# Patient Record
Sex: Female | Born: 2000 | Race: White | Hispanic: No | Marital: Single | Smoking: Never smoker
Health system: Southern US, Community
[De-identification: ages and names within clinical notes are randomized; demographics above are authoritative.]

## PROBLEM LIST (undated history)

## (undated) DIAGNOSIS — S060XAA Concussion with loss of consciousness status unknown, initial encounter: Secondary | ICD-10-CM

## (undated) DIAGNOSIS — S060X9A Concussion with loss of consciousness of unspecified duration, initial encounter: Secondary | ICD-10-CM

## (undated) DIAGNOSIS — F329 Major depressive disorder, single episode, unspecified: Secondary | ICD-10-CM

## (undated) DIAGNOSIS — G43109 Migraine with aura, not intractable, without status migrainosus: Secondary | ICD-10-CM

## (undated) DIAGNOSIS — F32A Depression, unspecified: Secondary | ICD-10-CM

## (undated) HISTORY — PX: TONSILLECTOMY: SUR1361

## (undated) HISTORY — PX: WISDOM TOOTH EXTRACTION: SHX21

## (undated) HISTORY — DX: Migraine with aura, not intractable, without status migrainosus: G43.109

## (undated) HISTORY — DX: Depression, unspecified: F32.A

---
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
## 2021-04-12 IMAGING — CR LUMBAR SPINE - 2-3 VIEW
1 series · 3 of 3 positions shown · non-contrast
Comparison: 11/28/2015

CLINICAL DATA: Chronic left-sided low back pain

EXAM:
LUMBAR SPINE - 2-3 VIEW

[Series 1: dg lumbar spine 2-3 views · 0.14mm/px · 3 of 3 slices shown]
[im 1/3]
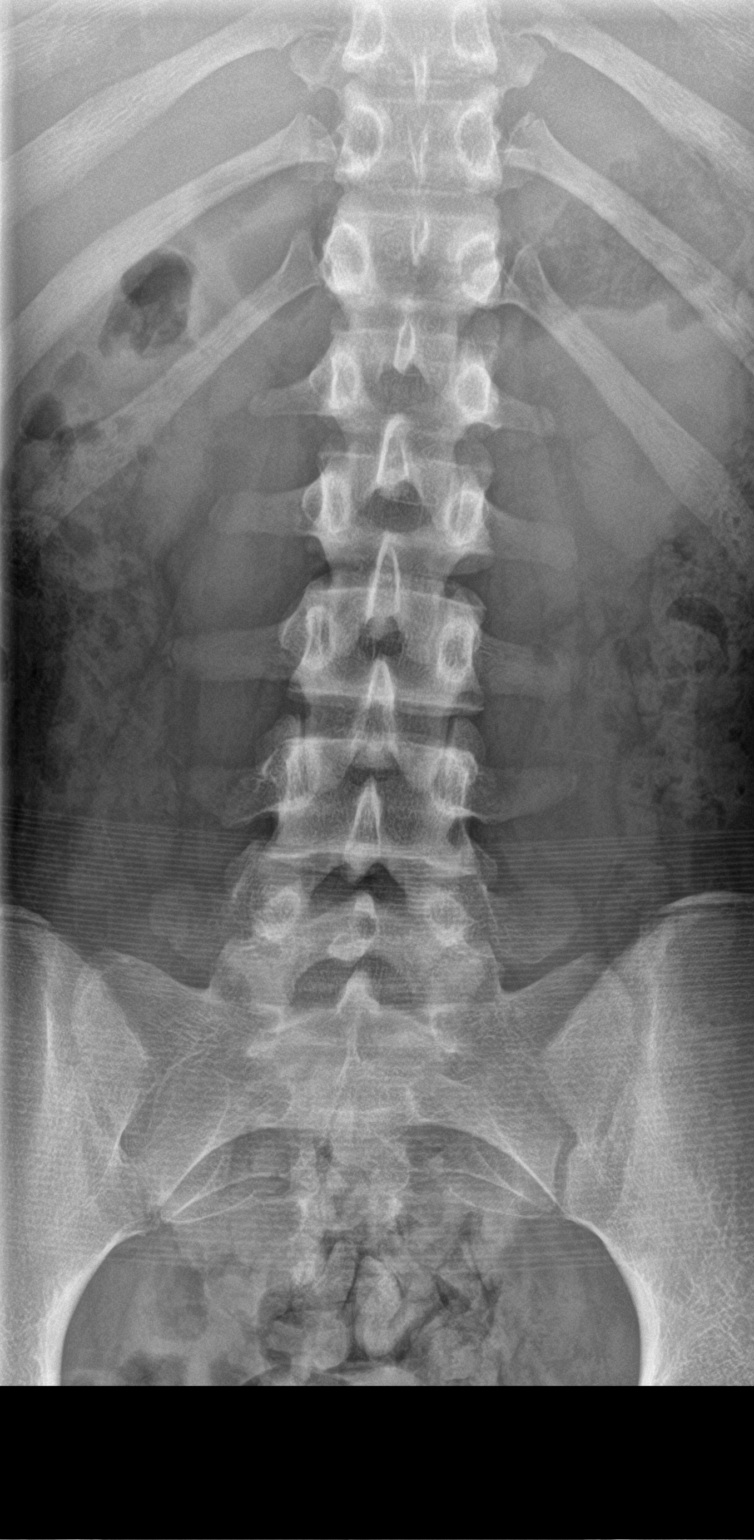
[im 2/3]
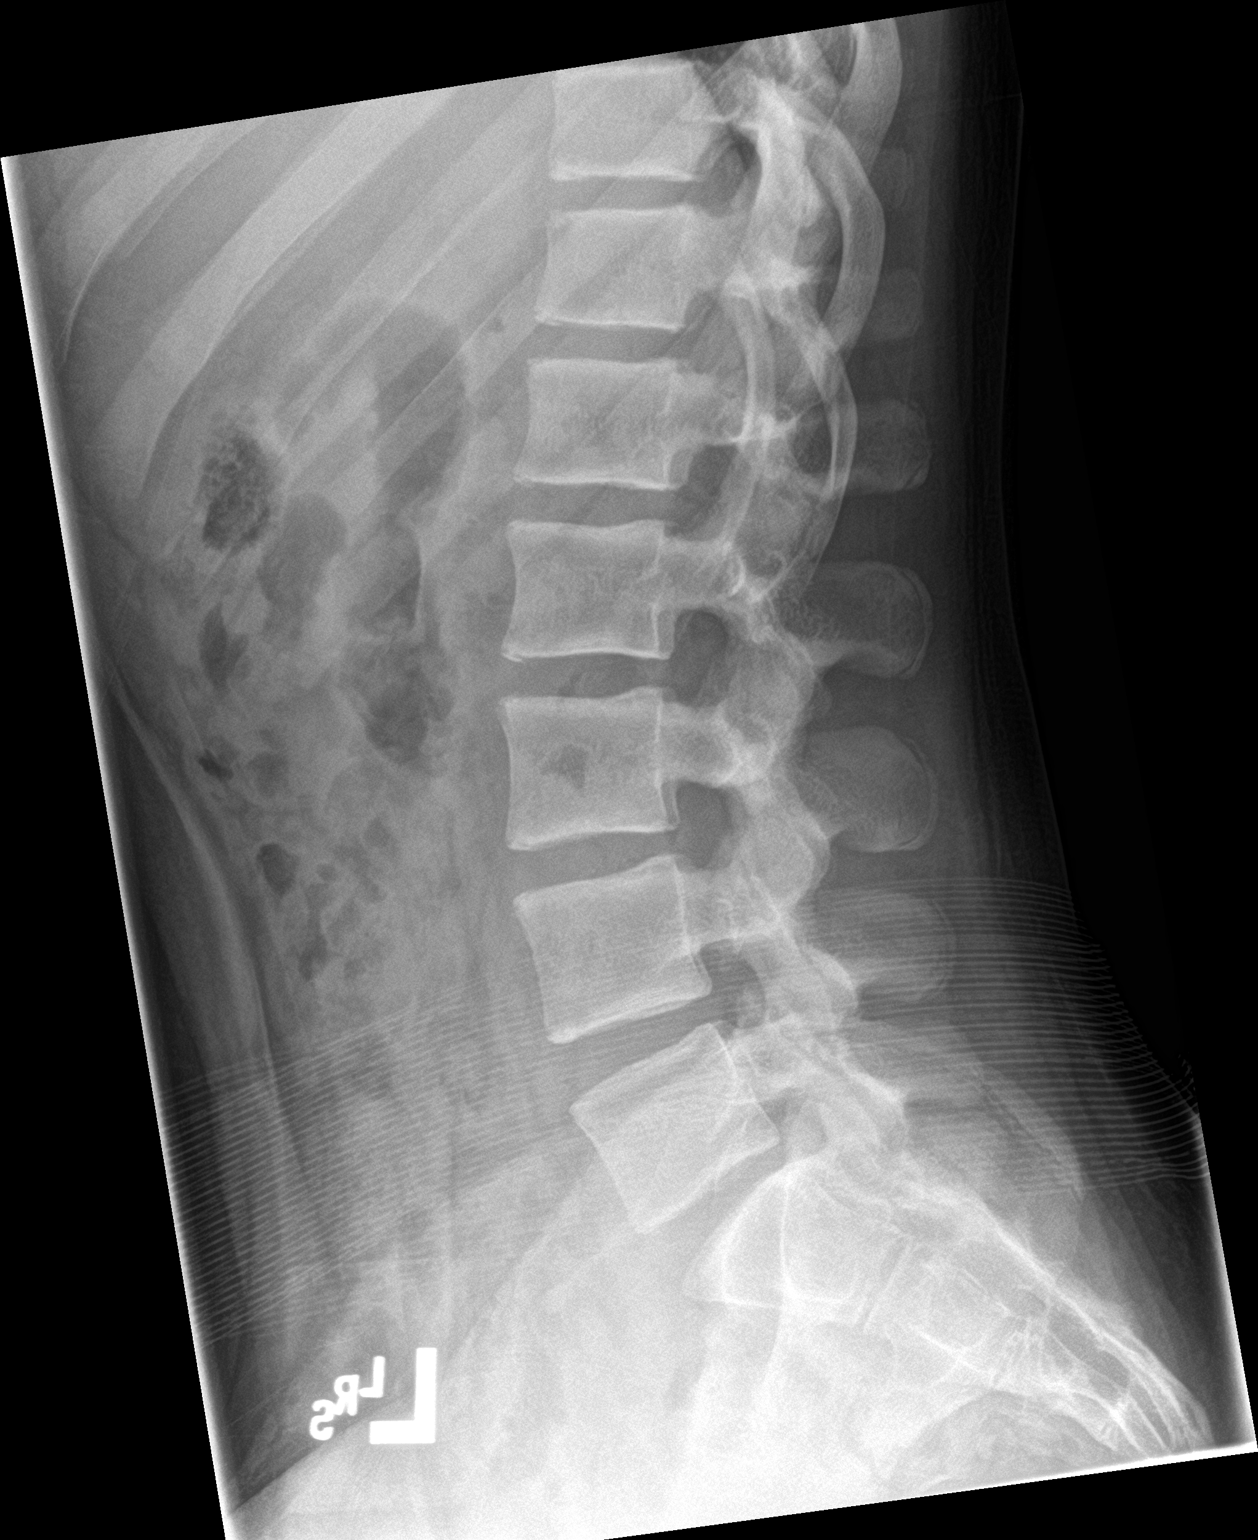
[im 3/3]
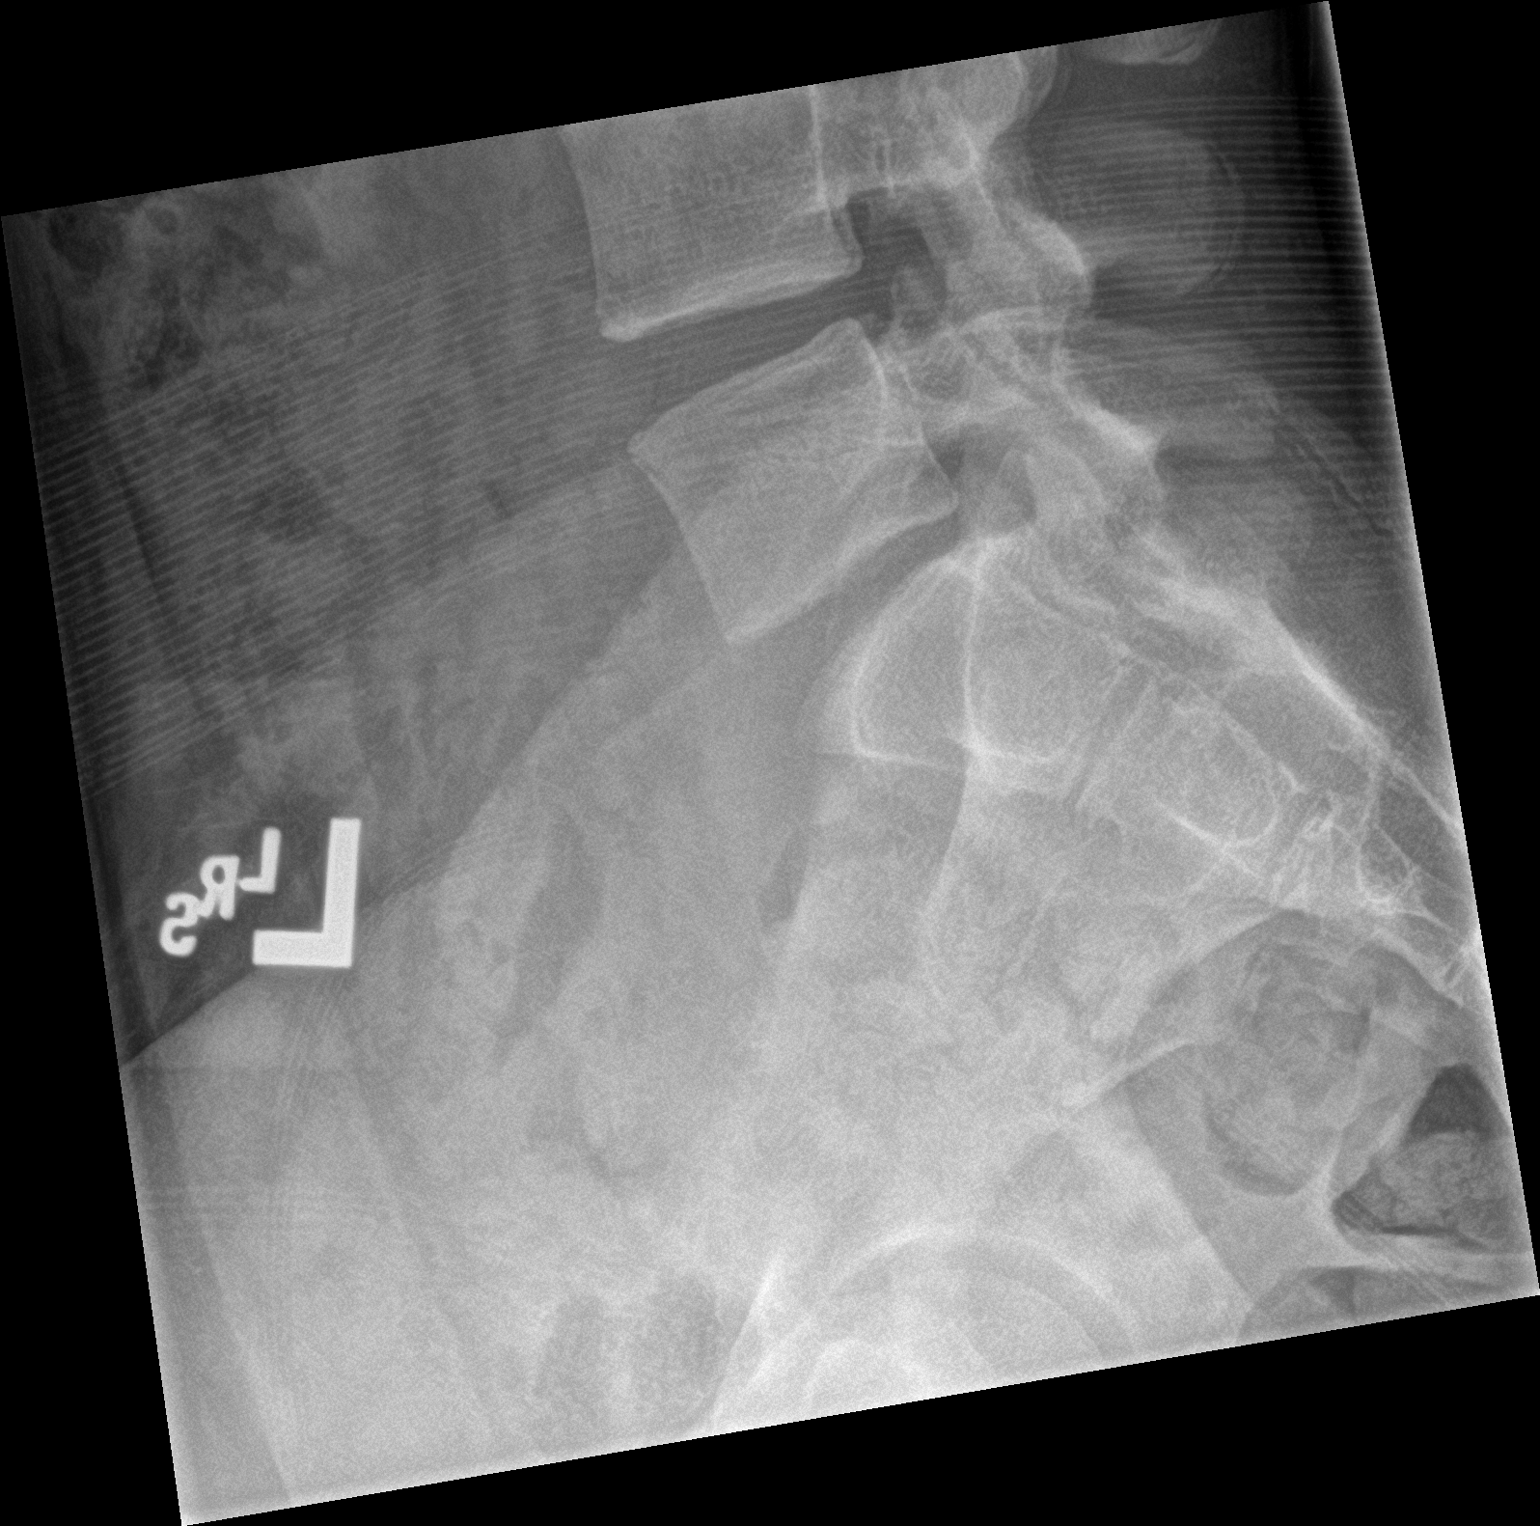

[3 of 3 positions shown; findings below may reference images not displayed]

FINDINGS: There are 5 nonrib bearing lumbar-type vertebral bodies.

The vertebral body heights are maintained.

The alignment is anatomic. There is no static listhesis. There is no
spondylolysis.

There is no acute fracture.

The disc spaces are maintained.

The SI joints are unremarkable.
IMPRESSION: No acute osseous injury of the lumbar spine.

## 2021-09-10 ENCOUNTER — Other Ambulatory Visit: Payer: Self-pay | Admitting: Pediatrics

## 2021-09-10 DIAGNOSIS — N63 Unspecified lump in unspecified breast: Secondary | ICD-10-CM

## 2021-09-19 ENCOUNTER — Ambulatory Visit
Admission: RE | Admit: 2021-09-19 | Discharge: 2021-09-19 | Disposition: A | Discharge status: Home / Self Care | Payer: Medicaid Other | Source: Ambulatory Visit | Attending: Pediatrics | Admitting: Pediatrics

## 2021-09-19 DIAGNOSIS — N63 Unspecified lump in unspecified breast: Secondary | ICD-10-CM

## 2022-07-12 ENCOUNTER — Other Ambulatory Visit: Payer: Self-pay

## 2022-07-12 ENCOUNTER — Emergency Department
Admission: EM | Admit: 2022-07-12 | Discharge: 2022-07-13 | Disposition: A | Discharge status: Other Acute Inpatient Hospital | Payer: Medicaid Other | Attending: Emergency Medicine | Admitting: Emergency Medicine

## 2022-07-12 DIAGNOSIS — F329 Major depressive disorder, single episode, unspecified: Secondary | ICD-10-CM | POA: Insufficient documentation

## 2022-07-12 DIAGNOSIS — F410 Panic disorder [episodic paroxysmal anxiety] without agoraphobia: Secondary | ICD-10-CM

## 2022-07-12 DIAGNOSIS — F411 Generalized anxiety disorder: Secondary | ICD-10-CM

## 2022-07-12 DIAGNOSIS — Z1152 Encounter for screening for COVID-19: Secondary | ICD-10-CM | POA: Insufficient documentation

## 2022-07-12 DIAGNOSIS — F32A Depression, unspecified: Secondary | ICD-10-CM | POA: Diagnosis present

## 2022-07-12 DIAGNOSIS — F331 Major depressive disorder, recurrent, moderate: Secondary | ICD-10-CM | POA: Diagnosis not present

## 2022-07-12 DIAGNOSIS — F339 Major depressive disorder, recurrent, unspecified: Secondary | ICD-10-CM | POA: Insufficient documentation

## 2022-07-12 LAB — ETHANOL: Alcohol, Ethyl (B): <10 mg/dL (ref <10)

## 2022-07-12 LAB — URINE DRUG SCREEN, QUALITATIVE (ARMC ONLY)
Amphetamines, Ur Screen: NOT DETECTED
Barbiturates, Ur Screen: NOT DETECTED
Benzodiazepine, Ur Scrn: NOT DETECTED
Cannabinoid 50 Ng, Ur ~~LOC~~: POSITIVE — AB
Cocaine Metabolite,Ur ~~LOC~~: NOT DETECTED
MDMA (Ecstasy)Ur Screen: NOT DETECTED
Methadone Scn, Ur: NOT DETECTED
Opiate, Ur Screen: NOT DETECTED
Phencyclidine (PCP) Ur S: NOT DETECTED
Tricyclic, Ur Screen: NOT DETECTED

## 2022-07-12 LAB — COMPREHENSIVE METABOLIC PANEL
ALT: 17 U/L (ref 0–44)
AST: 24 U/L (ref 15–41)
Albumin: 4.4 g/dL (ref 3.5–5.0)
Alkaline Phosphatase: 84 U/L (ref 38–126)
Anion gap: 9 (ref 5–15)
BUN: 12 mg/dL (ref 6–20)
CO2: 26 mmol/L (ref 22–32)
Calcium: 9 mg/dL (ref 8.9–10.3)
Chloride: 102 mmol/L (ref 98–111)
Creatinine, Ser: 0.75 mg/dL (ref 0.44–1.00)
GFR, Estimated: >60 mL/min (ref >60)
Glucose, Bld: 82 mg/dL (ref 70–99)
Potassium: 3.7 mmol/L (ref 3.5–5.1)
Sodium: 137 mmol/L (ref 135–145)
Total Bilirubin: 0.6 mg/dL (ref 0.3–1.2)
Total Protein: 8.2 g/dL — ABNORMAL HIGH (ref 6.5–8.1)

## 2022-07-12 LAB — CBC
HCT: 40.3 % (ref 36.0–46.0)
Hemoglobin: 13.2 g/dL (ref 12.0–15.0)
MCH: 29.5 pg (ref 26.0–34.0)
MCHC: 32.8 g/dL (ref 30.0–36.0)
MCV: 90.2 fL (ref 80.0–100.0)
Platelets: 377 10*3/uL (ref 150–400)
RBC: 4.47 MIL/uL (ref 3.87–5.11)
RDW: 12 % (ref 11.5–15.5)
WBC: 5.2 10*3/uL (ref 4.0–10.5)
nRBC: 0 % (ref 0.0–0.2)

## 2022-07-12 LAB — SALICYLATE LEVEL: Salicylate Lvl: <7 mg/dL — ABNORMAL LOW (ref 7.0–30.0)

## 2022-07-12 LAB — POC URINE PREG, ED: Preg Test, Ur: NEGATIVE

## 2022-07-12 LAB — ACETAMINOPHEN LEVEL: Acetaminophen (Tylenol), Serum: <10 ug/mL — ABNORMAL LOW (ref 10–30)

## 2022-07-12 MED ORDER — LORAZEPAM 1 MG PO TABS
1.0000 mg | ORAL_TABLET | Freq: Once | ORAL | Status: AC
  Administered 2022-07-12: 1 mg via ORAL
  Filled 2022-07-12: qty 1

## 2022-07-12 NOTE — ED Notes (Addendum)
Pt given nighttime snack. Pt stated "I haven't really been eating". Pt accepted snack and encouraged to eat.

## 2022-07-12 NOTE — ED Notes (Signed)
Pt arrived from triage

## 2022-07-12 NOTE — ED Notes (Signed)
Pt very tearful stating "I want to leave now, I'm done". MD, RN, and TTS notified.

## 2022-07-12 NOTE — ED Triage Notes (Addendum)
Pt states "I'm not doing good, I'm really sick." Pt reports she's been throwing up a lot, has been tearful frequently, has not been sleeping for a week. Friend states she's been using drugs and acting erratically and has lost 30 pounds this year, reports family history of bipolar disorder. Friend reports passing of grandmother as precipitating event. Pt is prescribed prozac but has not been taking it regularly. Pt states "I've been doing some crazy shit." Pt reports thoughts of self harm, has no plan. Pt denies HI. Pt AOX4, tearful in triage, cooperative.

## 2022-07-12 NOTE — ED Notes (Signed)
Pt called someone to come get her. Pt convinced by friend to stay and take medicine to try to get some sleep and stay through the night. RN notified.

## 2022-07-12 NOTE — ED Notes (Signed)
Pt expressed concern about being in the hallway and stated "If I have to sleep here then I'm leaving". Pt aware that there are no rooms available at this time. Pt encouraged to stay long enough to speak to psychiatry.

## 2022-07-12 NOTE — BH Assessment (Signed)
Comprehensive Clinical Assessment (CCA) Screening, Triage and Referral Note  07/12/2022 Hannah Le [id number] Recommendations for Services/Supports/Treatments: Consulted with Hannah Lore D., NP, who recommends pt receiver inpatient criteria.  Hannah Le is a 22 year old, English speaking, Caucasian female with a hx of MDD, and anxiety. Pt was visibly anxious and cooperative. Pt had a disheveled appearance. Pt was alert and oriented x5. Pt's speech was linear, goal directed, and coherent. Pt's thought processes were logical and relevant to the content of the conversation. Motor behavior appeared restless as her feet were shaking incessantly. Eye contact was good. Pt's mood is depressed; affect is tearful. Patient was noted to have good insight as she was able to understand the ways in which her depression and anxiety sx impact her ability to properly function. Pt reported having problems with ADLs, frequent crying spells, and panic attacks. Pt explained that she often throws up when her anxiety is high. Pt identified her stressors as the loss of multiple family members in the last year. Pt explained that her grandmother passed away about 1 month ago. 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 The pt admitted to alcohol, weed, and cocaine use explaining that she last consumed alcohol and weed on Thursday 07/10/22. Pt reported that she gets "black out drunk". The pt. reported that she did not sleep at all last night. The patient denied NSSIB, SI, HI, and AV/H.  Chief Complaint:  Chief Complaint  Patient presents with   Psychiatric Evaluation   Visit Diagnosis: MDD, recurrent severe GAD Panic Disorder  Patient Reported Information How did you hear about [place]? Family/Friend  What Is the Reason for Your Visit/Call Today? Pt states "I'm not doing good, I'm really sick." Pt reports she's been throwing up a lot, has been tearful  frequently, has not been sleeping for a week. Friend states she's been using drugs and acting erratically and has lost 30 pounds this year, reports family history of bipolar disorder. Friend reports passing of grandmother as precipitating event. Pt is prescribed prozac but has not been taking it regularly. Pt states "I've been doing some crazy shit." Pt reports thoughts of self harm, has no plan.  How Long Has This Been Causing You Problems? > than 6 months  What Do You Feel Would Help You the Most Today? Stress Management; Treatment for Depression or other mood problem   Have You Recently Had Any Thoughts About Hurting Yourself? No  Are You Planning to Commit Suicide/Harm Yourself At This time? No   Have you Recently Had Thoughts About [place]? No  Are You Planning to Harm Someone at This Time? No  Explanation: n/a   Have You Used Any Alcohol or Drugs in the Past 24 Hours? No  How Long Ago Did You Use Drugs or Alcohol? No data recorded What Did You Use and How Much? n/a   Do You Currently Have a Therapist/Psychiatrist? No  Name of Therapist/Psychiatrist: n/a   Have You Been Recently Discharged From Any Office Practice or Programs? No  Explanation of Discharge From Practice/Program: n/a    CCA Screening Triage Referral Assessment Type of Contact: Face-to-Face  Telemedicine Service Delivery:   Is this Initial or Reassessment?   Date Telepsych consult ordered in [facility]:    Time Telepsych consult ordered in [facility]:    Location of Assessment: [facility] ED  Provider Location: [facility] ED    Collateral Involvement: None provided   Does Patient Have a Court  Appointed Legal Guardian? No data recorded Name and Contact of Legal Guardian: No data recorded If Minor and Not Living with Parent(s), Who has Custody? n/a  Is CPS involved or ever been involved? Never  Is APS involved or ever been involved? Never   Patient Determined To Be At Risk for Harm To Self or Others  Based on Review of Patient Reported Information or Presenting Complaint? No  Method: No Plan  Availability of Means: No access or NA  Intent: Vague intent or NA  Notification Required: No need or identified person  Additional Information for Danger to Others Potential: -- (n/a)  Additional Comments for Danger to Others Potential: n/a  Are There Guns or Other Weapons in Your Home? No  Types of Guns/Weapons: n/a  Are These Weapons Safely Secured?                            -- (n/a)  Who Could Verify You Are Able To Have These Secured: n/a  Do You Have any Outstanding Charges, Pending Court Dates, Parole/Probation? n/a  Contacted To Inform of Risk of Harm To Self or Others: Other: Comment   Does Patient Present under Involuntary Commitment? No    [place] of Residence: [place]   Patient Currently Receiving the Following Services: Not Receiving Services   Determination of Need: Emergent (2 hours)   Options For Referral: ED Visit   Discharge Disposition:     Hannah Le R Hannah Le, LCAS

## 2022-07-12 NOTE — ED Provider Notes (Signed)
[facility] Provider Note    Event Date/Time   First MD Initiated Contact with Patient 07/12/22 1818     (approximate)   History   Psychiatric Evaluation   HPI  Hannah Le is a 22 y.o. female who presents to the emergency department today because of concerns for depression.  The patient states that she has been sad for quite some time.  She states that she has had a number of recent deaths in her family.  She is also concerned she might be manic because she says she has been doing things that she would not normally do although she would not elaborate on this.  She does state that she has had decreased appetite and sleep. Denies thoughts of self harm but states she sometimes thinks things would be easier if she wasn't there.     Physical Exam   Triage Vital Signs: ED Triage Vitals  Enc Vitals Group     BP 07/12/22 1745 120/73     Pulse Rate 07/12/22 1745 65     Resp 07/12/22 1745 18     Temp 07/12/22 1745 99.5 F (37.5 C)     Temp Source 07/12/22 1745 Oral     SpO2 07/12/22 1745 99 %     Weight 07/12/22 1746 100 lb (45.4 kg)     Height 07/12/22 1746 5\' 2"  (1.575 m)     Head Circumference --      Peak Flow --      Pain Score 07/12/22 1745 4     Pain Loc --      Pain Edu? --      Excl. in [place]? --     Most recent vital signs: Vitals:   07/12/22 1745  BP: 120/73  Pulse: 65  Resp: 18  Temp: 99.5 F (37.5 C)  SpO2: 99%   General: Awake, alert, oriented. CV:  Good peripheral perfusion. Regular rate and rhythm. Resp:  Normal effort. Lungs clear. Abd:  No distention.  Other:  Tearful.   ED Results / Procedures / Treatments   Labs (all labs ordered are listed, but only abnormal results are displayed) Labs Reviewed  COMPREHENSIVE METABOLIC PANEL - Abnormal; Notable for the following components:      Result Value   Total Protein 8.2 (*)    All other components within normal limits  SALICYLATE LEVEL - Abnormal; Notable for the following  components:   Salicylate Lvl Q000111Q (*)    All other components within normal limits  ACETAMINOPHEN LEVEL - Abnormal; Notable for the following components:   Acetaminophen (Tylenol), Serum <10 (*)    All other components within normal limits  URINE DRUG SCREEN, QUALITATIVE ([facility] ONLY) - Abnormal; Notable for the following components:   Cannabinoid 50 Ng, Ur [place] POSITIVE (*)    All other components within normal limits  ETHANOL  CBC  POC URINE PREG, ED     EKG  None   RADIOLOGY None   PROCEDURES:  Critical Care performed: No    MEDICATIONS ORDERED IN ED: Medications - No data to display   IMPRESSION / MDM / [place] / ED COURSE  I reviewed the triage vital signs and the nursing notes.                              Differential diagnosis includes, but is not limited to, depression, bipolar  Patient's presentation is most consistent with  acute presentation with potential threat to life or bodily function.   Patient presented to the emergency department today because of concerns for depression.  On exam patient is quite tearful.  She denies any SI.  Psychiatry team was consulted and recommended inpatient admission.  The patient has been placed in psychiatric observation due to the need to provide a safe environment for the patient while obtaining psychiatric consultation and evaluation, as well as ongoing medical and medication management to treat the patient's condition.  The patient has not been placed under full IVC at this time.    FINAL CLINICAL IMPRESSION(S) / ED DIAGNOSES   Final diagnoses:  Depression, unspecified depression type     Note:  This document was prepared using Dragon voice recognition software and may include unintentional dictation errors.    Nance Pear, MD 07/12/22 [id number]

## 2022-07-12 NOTE — ED Notes (Signed)
Pt speaking with TTS/ pt is tearful and wishes to get a room/ this nurse and other staff has explained that there are no available rooms/ pt states she is going to leave if she has to sleep in the hallway/ MD and charge made aware. Md has no plans of IVC this pt at this time
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
## 2022-07-12 NOTE — BH Assessment (Signed)
TTS consulted this Provider about this patient.  Currently patient is depressed, tearful, has been using drugs and has come to the ER voluntary for help.   Inpatient hospitalization is recommended, however, patient is to remain voluntary.  Patient is not actively suicidal, homicidal and is not actively psychotic.  Outside resources can be provided to patient should she decide to leave and not become inpatient.

## 2022-07-13 ENCOUNTER — Other Ambulatory Visit: Payer: Self-pay

## 2022-07-13 ENCOUNTER — Inpatient Hospital Stay (HOSPITAL_COMMUNITY)
Admission: AD | Admit: 2022-07-13 | Discharge: 2022-07-16 | DRG: 885 | Disposition: A | Discharge status: Home / Self Care | Payer: Medicaid Other | Source: Intra-hospital | Attending: Psychiatry | Admitting: Psychiatry

## 2022-07-13 ENCOUNTER — Encounter (HOSPITAL_COMMUNITY): Payer: Self-pay | Admitting: Psychiatric/Mental Health

## 2022-07-13 DIAGNOSIS — F909 Attention-deficit hyperactivity disorder, unspecified type: Secondary | ICD-10-CM | POA: Diagnosis present

## 2022-07-13 DIAGNOSIS — F1210 Cannabis abuse, uncomplicated: Secondary | ICD-10-CM | POA: Diagnosis present

## 2022-07-13 DIAGNOSIS — Z716 Tobacco abuse counseling: Secondary | ICD-10-CM | POA: Diagnosis not present

## 2022-07-13 DIAGNOSIS — Z87820 Personal history of traumatic brain injury: Secondary | ICD-10-CM | POA: Diagnosis not present

## 2022-07-13 DIAGNOSIS — F502 Bulimia nervosa: Secondary | ICD-10-CM | POA: Diagnosis present

## 2022-07-13 DIAGNOSIS — F1010 Alcohol abuse, uncomplicated: Secondary | ICD-10-CM | POA: Diagnosis present

## 2022-07-13 DIAGNOSIS — Z634 Disappearance and death of family member: Secondary | ICD-10-CM | POA: Diagnosis not present

## 2022-07-13 DIAGNOSIS — F331 Major depressive disorder, recurrent, moderate: Secondary | ICD-10-CM | POA: Diagnosis not present

## 2022-07-13 DIAGNOSIS — Z8659 Personal history of other mental and behavioral disorders: Secondary | ICD-10-CM | POA: Diagnosis not present

## 2022-07-13 DIAGNOSIS — F17290 Nicotine dependence, other tobacco product, uncomplicated: Secondary | ICD-10-CM | POA: Diagnosis present

## 2022-07-13 DIAGNOSIS — K5900 Constipation, unspecified: Secondary | ICD-10-CM | POA: Diagnosis present

## 2022-07-13 DIAGNOSIS — Z79899 Other long term (current) drug therapy: Secondary | ICD-10-CM

## 2022-07-13 DIAGNOSIS — F339 Major depressive disorder, recurrent, unspecified: Secondary | ICD-10-CM | POA: Insufficient documentation

## 2022-07-13 DIAGNOSIS — F4310 Post-traumatic stress disorder, unspecified: Secondary | ICD-10-CM | POA: Diagnosis present

## 2022-07-13 DIAGNOSIS — Z975 Presence of (intrauterine) contraceptive device: Secondary | ICD-10-CM | POA: Diagnosis not present

## 2022-07-13 DIAGNOSIS — Z1152 Encounter for screening for COVID-19: Secondary | ICD-10-CM

## 2022-07-13 DIAGNOSIS — F410 Panic disorder [episodic paroxysmal anxiety] without agoraphobia: Secondary | ICD-10-CM | POA: Diagnosis present

## 2022-07-13 DIAGNOSIS — F1090 Alcohol use, unspecified, uncomplicated: Secondary | ICD-10-CM | POA: Insufficient documentation

## 2022-07-13 DIAGNOSIS — F329 Major depressive disorder, single episode, unspecified: Principal | ICD-10-CM | POA: Diagnosis present

## 2022-07-13 DIAGNOSIS — F411 Generalized anxiety disorder: Secondary | ICD-10-CM | POA: Diagnosis present

## 2022-07-13 DIAGNOSIS — F1290 Cannabis use, unspecified, uncomplicated: Secondary | ICD-10-CM | POA: Insufficient documentation

## 2022-07-13 DIAGNOSIS — G4700 Insomnia, unspecified: Secondary | ICD-10-CM | POA: Diagnosis present

## 2022-07-13 DIAGNOSIS — F3110 Bipolar disorder, current episode manic without psychotic features, unspecified: Principal | ICD-10-CM | POA: Diagnosis present

## 2022-07-13 LAB — RESP PANEL BY RT-PCR (RSV, FLU A&B, COVID)  RVPGX2
Influenza A by PCR: NEGATIVE
Influenza B by PCR: NEGATIVE
Resp Syncytial Virus by PCR: NEGATIVE
SARS Coronavirus 2 by RT PCR: NEGATIVE

## 2022-07-13 MED ORDER — TRAZODONE HCL 50 MG PO TABS
50.0000 mg | ORAL_TABLET | Freq: Every evening | ORAL | Status: DC | PRN
  Administered 2022-07-13 – 2022-07-15 (×3): 50 mg via ORAL
  Filled 2022-07-13 (×3): qty 1

## 2022-07-13 MED ORDER — DIPHENHYDRAMINE HCL 50 MG/ML IJ SOLN: 50.0000 mg | Freq: Three times a day (TID) | INTRAMUSCULAR | Status: DC | PRN

## 2022-07-13 MED ORDER — LORATADINE 10 MG PO TABS
10.0000 mg | ORAL_TABLET | Freq: Every day | ORAL | Status: DC
  Administered 2022-07-14 – 2022-07-16 (×3): 10 mg via ORAL
  Filled 2022-07-13 (×5): qty 1

## 2022-07-13 MED ORDER — DEXMETHYLPHENIDATE HCL 5 MG PO TABS
10.0000 mg | ORAL_TABLET | Freq: Every day | ORAL | Status: DC
  Filled 2022-07-13: qty 2

## 2022-07-13 MED ORDER — FLUOXETINE HCL 20 MG PO CAPS
40.0000 mg | ORAL_CAPSULE | Freq: Every day | ORAL | Status: DC
  Administered 2022-07-13: 40 mg via ORAL
  Filled 2022-07-13: qty 2

## 2022-07-13 MED ORDER — OLANZAPINE 10 MG IM SOLR: 5.0000 mg | Freq: Two times a day (BID) | INTRAMUSCULAR | Status: DC | PRN

## 2022-07-13 MED ORDER — MAGNESIUM HYDROXIDE 400 MG/5ML PO SUSP: 30.0000 mL | Freq: Every day | ORAL | Status: DC | PRN

## 2022-07-13 MED ORDER — DIPHENHYDRAMINE HCL 50 MG/ML IJ SOLN: 50.0000 mg | Freq: Two times a day (BID) | INTRAMUSCULAR | Status: DC | PRN

## 2022-07-13 MED ORDER — ACETAMINOPHEN 325 MG PO TABS: 650.0000 mg | ORAL_TABLET | Freq: Four times a day (QID) | ORAL | Status: DC | PRN

## 2022-07-13 MED ORDER — HALOPERIDOL 5 MG PO TABS: 5.0000 mg | ORAL_TABLET | Freq: Three times a day (TID) | ORAL | Status: DC | PRN

## 2022-07-13 MED ORDER — HYDROXYZINE HCL 25 MG PO TABS
25.0000 mg | ORAL_TABLET | Freq: Three times a day (TID) | ORAL | Status: DC | PRN
  Filled 2022-07-13: qty 1

## 2022-07-13 MED ORDER — DEXMETHYLPHENIDATE HCL ER 5 MG PO CP24
15.0000 mg | ORAL_CAPSULE | Freq: Every morning | ORAL | Status: DC
  Administered 2022-07-13: 15 mg via ORAL
  Filled 2022-07-13: qty 3

## 2022-07-13 MED ORDER — LORATADINE 10 MG PO TABS
10.0000 mg | ORAL_TABLET | Freq: Every day | ORAL | Status: DC
  Administered 2022-07-13: 10 mg via ORAL
  Filled 2022-07-13: qty 1

## 2022-07-13 MED ORDER — LORAZEPAM 1 MG PO TABS: 2.0000 mg | ORAL_TABLET | Freq: Three times a day (TID) | ORAL | Status: DC | PRN

## 2022-07-13 MED ORDER — LORAZEPAM 2 MG/ML IJ SOLN: 2.0000 mg | Freq: Three times a day (TID) | INTRAMUSCULAR | Status: DC | PRN

## 2022-07-13 MED ORDER — HALOPERIDOL LACTATE 5 MG/ML IJ SOLN: 5.0000 mg | Freq: Three times a day (TID) | INTRAMUSCULAR | Status: DC | PRN

## 2022-07-13 MED ORDER — FLUOXETINE HCL 20 MG PO CAPS
40.0000 mg | ORAL_CAPSULE | Freq: Every day | ORAL | Status: DC
  Administered 2022-07-14: 40 mg via ORAL
  Filled 2022-07-13 (×4): qty 2

## 2022-07-13 MED ORDER — ALUM & MAG HYDROXIDE-SIMETH 200-200-20 MG/5ML PO SUSP: 30.0000 mL | ORAL | Status: DC | PRN

## 2022-07-13 MED ORDER — DIPHENHYDRAMINE HCL 25 MG PO CAPS: 50.0000 mg | ORAL_CAPSULE | Freq: Three times a day (TID) | ORAL | Status: DC | PRN

## 2022-07-13 NOTE — Progress Notes (Signed)
   07/13/22 2301  Psych Admission Type (Psych Patients Only)  Admission Status Voluntary  Psychosocial Assessment  Patient Complaints Anxiety  Eye Contact Brief  Facial Expression Anxious  Affect Appropriate to circumstance  Speech Logical/coherent  Interaction Minimal  Motor Activity Other (Comment) (WDL)  Appearance/Hygiene Unremarkable  Behavior Characteristics Appropriate to situation  Mood Pleasant  Thought Process  Coherency WDL  Content WDL  Delusions None reported or observed  Perception WDL  Hallucination None reported or observed  Judgment Impaired  Confusion None  Danger to Self  Current suicidal ideation? Denies  Agreement Not to Harm Self Yes  Description of Agreement verbal  Danger to Others  Danger to Others None reported or observed

## 2022-07-13 NOTE — ED Notes (Signed)
VOL/pending psych inpatient admission when medically cleared. ?

## 2022-07-13 NOTE — Progress Notes (Signed)
Pt. Accepted to [facility]. Room ready at 12PM.

## 2022-07-13 NOTE — Consult Note (Signed)
[facility] Psychiatry Consult   Reason for Consult:  Psych Evaluation Referring Physician:  Dr. Archie Balboa Patient Identification: Hannah Le MRN:  [id number] Principal Diagnosis: MDD (major depressive disorder), recurrent episode ([place]) Diagnosis:  Principal Problem:   MDD (major depressive disorder), recurrent episode ([place])   Total Time spent with patient: 45 minutes  Subjective:   Hannah Le is a 22 y.o. female patient admitted with depression and anxiety.  HPI:  Patient location:  Hospital Provider location:  Remote   Hannah Le, 22 y.o., female patient reported this provider; chart reviewed and consulted with Dr. Archie Balboa on 07/13/22.  On evaluation HEER MATHUS reports that she has been feeling overwhelmed and depressed.  At this time she feels like her depression is getting out of control and sought help by coming voluntary to the er.  Per TTS,  Hannah Le is a 22 year old, English speaking, Caucasian female with a hx of MDD, and anxiety. Pt was visibly anxious and cooperative. Pt had a disheveled appearance. Pt was alert and oriented x5. Pt's speech was linear, goal directed, and coherent. Pt's thought processes were logical and relevant to the content of the conversation. Motor behavior appeared restless as her feet were shaking incessantly. Eye contact was good. Pt's mood is depressed; affect is tearful. Patient was noted to have good insight as she was able to understand the ways in which her depression and anxiety sx impact her ability to properly function. Pt reported having problems with ADLs, frequent crying spells, and panic attacks. Pt explained that she often throws up when her anxiety is high. Pt identified her stressors as the loss of multiple family members in the last year. Pt explained that her grandmother passed away about 1 month ago. 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 The pt  admitted to alcohol, weed, and cocaine use explaining that she last consumed alcohol and weed on Thursday 07/10/22. Pt reported that she gets "black out drunk". The pt. reported that she did not sleep at all last night. The patient denied NSSIB, SI, HI, and AV/H.    Recommendation: Inpatient hospitalization  Past Psychiatric History: MDD Anxiety  Risk to Self:   Risk to Others:   Prior Inpatient Therapy:   Prior Outpatient Therapy:    Past Medical History:  Past Medical History:  Diagnosis Date   Concussion    childhood concussion    Depression    Migraine with aura     Past Surgical History:  Procedure Laterality Date   TONSILLECTOMY     WISDOM TOOTH EXTRACTION     Family History:  Family History  Problem Relation Age of Onset   Hypertension Paternal Grandmother    Family Psychiatric  History: unknown Social History:  Social History   Substance and Sexual Activity  Alcohol Use Never     Social History   Substance and Sexual Activity  Drug Use Never    Social History   Socioeconomic History   Marital status: Single    Spouse name: Not on file   Number of children: Not on file   Years of education: Not on file   Highest education level: Not on file  Occupational History   Not on file  Tobacco Use   Smoking status: Never   Smokeless tobacco: Never  Vaping Use   Vaping Use: Never used  Substance and Sexual Activity   Alcohol use: Never   Drug use: Never  Sexual activity: Yes    Birth control/protection: Pill  Other Topics Concern   Not on file  Social History Narrative   Not on file   Social Determinants of Health   Financial Resource Strain: Not on file  Food Insecurity: Not on file  Transportation Needs: Not on file  Physical Activity: Not on file  Stress: Not on file  Social Connections: Not on file   Additional Social History:    Allergies:   Allergies  Allergen Reactions   Morphine Anaphylaxis and Other (See Comments)   Morphine And  Related Anaphylaxis   Penicillins Anaphylaxis    Has patient had a PCN reaction causing immediate rash, facial/tongue/throat swelling, SOB or lightheadedness with hypotension: Yes Has patient had a PCN reaction causing severe rash involving mucus membranes or skin necrosis: Unknown Has patient had a PCN reaction that required hospitalization: Unknown Has patient had a PCN reaction occurring within the last 10 years: Unknown If all of the above answers are "NO", then may proceed with Cephalosporin use.    Amoxicillin     Labs:  Results for orders placed or performed during the hospital encounter of 07/12/22 (from the past 48 hour(s))  Comprehensive metabolic panel     Status: Abnormal   Collection Time: 07/12/22  5:54 PM  Result Value Ref Range   Sodium 137 135 - 145 mmol/L   Potassium 3.7 3.5 - 5.1 mmol/L   Chloride 102 98 - 111 mmol/L   CO2 26 22 - 32 mmol/L   Glucose, Bld 82 70 - 99 mg/dL    Comment: Glucose reference range applies only to samples taken after fasting for at least 8 hours.   BUN 12 6 - 20 mg/dL   Creatinine, Ser 0.75 0.44 - 1.00 mg/dL   Calcium 9.0 8.9 - 10.3 mg/dL   Total Protein 8.2 (H) 6.5 - 8.1 g/dL   Albumin 4.4 3.5 - 5.0 g/dL   AST 24 15 - 41 U/L   ALT 17 0 - 44 U/L   Alkaline Phosphatase 84 38 - 126 U/L   Total Bilirubin 0.6 0.3 - 1.2 mg/dL   GFR, Estimated >60 >60 mL/min    Comment: (NOTE) Calculated using the CKD-EPI Creatinine Equation (2021)    Anion gap 9 5 - 15    Comment: Performed at [facility], [address] [place], [place] [address]  Ethanol     Status: None   Collection Time: 07/12/22  5:54 PM  Result Value Ref Range   Alcohol, Ethyl (B) <10 <10 mg/dL    Comment: (NOTE) Lowest detectable limit for serum alcohol is 10 mg/dL.  For medical purposes only. Performed at [facility], [address] [place], [place] [id number]   Salicylate level     Status: Abnormal   Collection Time: 07/12/22  5:54 PM  Result  Value Ref Range   Salicylate Lvl Q000111Q (L) 7.0 - 30.0 mg/dL    Comment: Performed at [facility], [address] [place], [place] [address]  Acetaminophen level     Status: Abnormal   Collection Time: 07/12/22  5:54 PM  Result Value Ref Range   Acetaminophen (Tylenol), Serum <10 (L) 10 - 30 ug/mL    Comment: (NOTE) Therapeutic concentrations vary significantly. A range of 10-30 ug/mL  may be an effective concentration for many patients. However, some  are best treated at concentrations outside of this range. Acetaminophen concentrations >150 ug/mL at 4 hours after ingestion  and >50 ug/mL at  12 hours after ingestion are often associated with  toxic reactions.  Performed at [facility], [address] [place], [place] [address]   cbc     Status: None   Collection Time: 07/12/22  5:54 PM  Result Value Ref Range   WBC 5.2 4.0 - 10.5 K/uL   RBC 4.47 3.87 - 5.11 MIL/uL   Hemoglobin 13.2 12.0 - 15.0 g/dL   HCT 40.3 36.0 - 46.0 %   MCV 90.2 80.0 - 100.0 fL   MCH 29.5 26.0 - 34.0 pg   MCHC 32.8 30.0 - 36.0 g/dL   RDW 12.0 11.5 - 15.5 %   Platelets 377 150 - 400 K/uL   nRBC 0.0 0.0 - 0.2 %    Comment: Performed at [facility], [address] [place], [place] [address]  Urine Drug Screen, Qualitative     Status: Abnormal   Collection Time: 07/12/22  5:54 PM  Result Value Ref Range   Tricyclic, Ur Screen NONE DETECTED NONE DETECTED   Amphetamines, Ur Screen NONE DETECTED NONE DETECTED   MDMA (Ecstasy)Ur Screen NONE DETECTED NONE DETECTED   Cocaine Metabolite,Ur [place] NONE DETECTED NONE DETECTED   Opiate, Ur Screen NONE DETECTED NONE DETECTED   Phencyclidine (PCP) Ur S NONE DETECTED NONE DETECTED   Cannabinoid 50 Ng, Ur [place] POSITIVE (A) NONE DETECTED   Barbiturates, Ur Screen NONE DETECTED NONE DETECTED   Benzodiazepine, Ur Scrn NONE DETECTED NONE DETECTED   Methadone Scn, Ur NONE DETECTED NONE DETECTED    Comment: (NOTE) Tricyclics + metabolites,  urine    Cutoff 1000 ng/mL Amphetamines + metabolites, urine  Cutoff 1000 ng/mL MDMA (Ecstasy), urine              Cutoff 500 ng/mL Cocaine Metabolite, urine          Cutoff 300 ng/mL Opiate + metabolites, urine        Cutoff 300 ng/mL Phencyclidine (PCP), urine         Cutoff 25 ng/mL Cannabinoid, urine                 Cutoff 50 ng/mL Barbiturates + metabolites, urine  Cutoff 200 ng/mL Benzodiazepine, urine              Cutoff 200 ng/mL Methadone, urine                   Cutoff 300 ng/mL  The urine drug screen provides only a preliminary, unconfirmed analytical test result and should not be used for non-medical purposes. Clinical consideration and professional judgment should be applied to any positive drug screen result due to possible interfering substances. A more specific alternate chemical method must be used in order to obtain a confirmed analytical result. Gas chromatography / mass spectrometry (GC/MS) is the preferred confirm atory method. Performed at [facility], [address] [place], [place] [address]   POC urine preg, ED     Status: None   Collection Time: 07/12/22  7:54 PM  Result Value Ref Range   Preg Test, Ur NEGATIVE NEGATIVE    Comment:        THE SENSITIVITY OF THIS METHODOLOGY IS >24 mIU/mL     No current facility-administered medications for this encounter.   Current Outpatient Medications  Medication Sig Dispense Refill   cetirizine (ZYRTEC) 10 MG tablet Take 10 mg by mouth daily.  5   dexmethylphenidate (FOCALIN XR) 15 MG 24 hr capsule Take 15 mg by mouth in the  morning.     dexmethylphenidate (FOCALIN) 10 MG tablet Take 10 mg by mouth daily in the afternoon.     FLUoxetine (PROZAC) 40 MG capsule Take 40 mg by mouth daily.     Levonorgestrel (KYLEENA) 19.5 MG IUD 1 each (19.5 mg total) by Intrauterine route once for 1 dose. 1 Intra Uterine Device 0    Musculoskeletal: Strength & Muscle Tone: within normal limits Gait & Station:  normal Patient leans: N/A    Physical Exam: Physical Exam Vitals and nursing note reviewed.  Psychiatric:        Cognition and Memory: Cognition and memory normal.        Judgment: Judgment normal.    Review of Systems  Psychiatric/Behavioral:  Positive for depression and substance abuse. Negative for hallucinations and suicidal ideas. The patient is nervous/anxious and has insomnia.   All other systems reviewed and are negative.  Blood pressure 116/81, pulse 64, temperature 98.3 F (36.8 C), temperature source Oral, resp. rate 17, height 5\' 2"  (1.575 m), weight 45.4 kg, SpO2 100 %. Body mass index is 18.29 kg/m.  Treatment Plan Summary: Daily contact with patient to assess and evaluate symptoms and progress in treatment, Medication management, and Plan   Plan:  Review of chart, vital signs, medications, and notes. 1-Individual and group therapy 2-Medication management for depression and anxiety:  Medications reviewed with the patient and he stated no untoward effects, no changes made 3-Coping skills for depression, anxiety, and PTSD 4-Continue crisis stabilization and management 5-Address health issues--monitoring vital signs, stable 6-Treatment plan in progress to prevent relapse of depression and anxiety  Disposition: Recommend psychiatric Inpatient admission when medically cleared. Supportive therapy provided about ongoing stressors. Discussed crisis plan, support from social network, calling 911, coming to the Emergency Department, and calling Suicide Hotline.  Deloria Lair, NP 07/13/2022 2:50 AM
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
## 2022-07-13 NOTE — ED Provider Notes (Signed)
Emergency Medicine Observation Re-evaluation Note  Hannah Le is a 22 y.o. female, seen on rounds today.  Pt initially presented to the ED for complaints of Psychiatric Evaluation  Currently, the patient is is no acute distress. Denies any concerns at this time.  Physical Exam  Blood pressure 116/81, pulse 64, temperature 98.3 F (36.8 C), temperature source Oral, resp. rate 17, height 5\' 2"  (1.575 m), weight 45.4 kg, SpO2 100 %.  Physical Exam: General: No apparent distress Pulm: Normal WOB Neuro: Moving all extremities Psych: Resting comfortably     ED Course / MDM     I have reviewed the labs performed to date as well as medications administered while in observation.  Recent changes in the last 24 hours include: No acute events overnight.  Plan   Current plan: Patient awaiting psychiatric disposition. Patient is not under full IVC at this time.    Velmer Woelfel, Hannah Bison, DO 07/13/22 [phone]

## 2022-07-13 NOTE — Plan of Care (Signed)
Nurse discussed anxiety, depression and coping skills with patient.  

## 2022-07-13 NOTE — Tx Team (Signed)
Initial Treatment Plan 07/13/2022 7:04 PM YORLENI HIBDON [id number]    PATIENT STRESSORS: Educational concerns   Financial difficulties   Health problems   Medication change or noncompliance   Substance abuse   Traumatic event     PATIENT STRENGTHS: Average or above average intelligence  Capable of independent living  Communication skills  General fund of knowledge  Motivation for treatment/growth  Physical Health  Supportive family/friends    PATIENT IDENTIFIED PROBLEMS: "anxiety"  "Depression"  "Substance abuse"  "Prevent suicidal thoughts"  "Panic attacks"  "Eating problems"           DISCHARGE CRITERIA:  Ability to meet basic life and health needs Adequate post-discharge living arrangements Improved stabilization in mood, thinking, and/or behavior Medical problems require only outpatient monitoring Motivation to continue treatment in a less acute level of care Need for constant or close observation no longer present Reduction of life-threatening or endangering symptoms to within safe limits Safe-care adequate arrangements made Verbal commitment to aftercare and medication compliance  PRELIMINARY DISCHARGE PLAN: Attend aftercare/continuing care group Attend PHP/IOP Attend 12-step recovery group Outpatient therapy Return to previous living arrangement Return to previous work or school arrangements  PATIENT/FAMILY INVOLVEMENT: This treatment plan has been presented to and reviewed with the patient, [name].  The patient and family have been given the opportunity to ask questions and make suggestions.  Grayland Ormond [place], [place] 07/13/2022, 7:04 PM

## 2022-07-13 NOTE — [facility] Group Notes (Signed)
[facility] Group Notes:  (Nursing/MHT/Case Management/Adjunct)  Date:  07/13/2022  Time:  8:54 PM  Type of Therapy:  Group Therapy  Participation Level:  Active  Participation Quality:  Appropriate  Affect:  Appropriate  Cognitive:  Appropriate  Insight:  Appropriate  Engagement in Group:  Engaged  Modes of Intervention:  Education  Summary of Progress/Problems: Pt reports no goal. Day was 0/10.  Orvan Falconer 07/13/2022, 8:54 PM

## 2022-07-14 ENCOUNTER — Encounter (HOSPITAL_COMMUNITY): Payer: Self-pay

## 2022-07-14 DIAGNOSIS — F1290 Cannabis use, unspecified, uncomplicated: Secondary | ICD-10-CM | POA: Insufficient documentation

## 2022-07-14 DIAGNOSIS — F4310 Post-traumatic stress disorder, unspecified: Secondary | ICD-10-CM | POA: Insufficient documentation

## 2022-07-14 DIAGNOSIS — F3110 Bipolar disorder, current episode manic without psychotic features, unspecified: Principal | ICD-10-CM

## 2022-07-14 DIAGNOSIS — F1090 Alcohol use, unspecified, uncomplicated: Secondary | ICD-10-CM | POA: Insufficient documentation

## 2022-07-14 DIAGNOSIS — F502 Bulimia nervosa: Secondary | ICD-10-CM | POA: Insufficient documentation

## 2022-07-14 MED ORDER — POLYETHYLENE GLYCOL 3350 17 G PO PACK: 17.0000 g | PACK | Freq: Every day | ORAL | Status: DC

## 2022-07-14 MED ORDER — POLYETHYLENE GLYCOL 3350 17 G PO PACK
17.0000 g | PACK | Freq: Every day | ORAL | Status: DC | PRN
  Administered 2022-07-15: 17 g via ORAL

## 2022-07-14 MED ORDER — ARIPIPRAZOLE 5 MG PO TABS
5.0000 mg | ORAL_TABLET | Freq: Every day | ORAL | Status: DC
  Administered 2022-07-14 – 2022-07-16 (×3): 5 mg via ORAL
  Filled 2022-07-14 (×5): qty 1

## 2022-07-14 NOTE — Progress Notes (Signed)
Patient presents with depressed mood, affect labile. Faelynn this am upon approach states '' I would like to have genetic testing, and see what medications will work to help my depression. You see, I've been on this prozac since the summer and it's done nothing for me. And my family has a hx of bipolar disorder so I don't know if that it. I also need to get back on my adderall. ''  Patient later approached writer and was very irritable, stating she would '' like to sign that 72 hour form now. I hate the food here, I hate being here. I want to get better and go home. I have things to do '' pt becomes tearful, allowed to ventilate and support given.  Patient compliant with medications. Above discussed with treatment team and MD notified. Also of note, patient reports constipation on self inventory form. 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  Patient did complete self inventory and rates depression and anxiety at 9/10 on scale, 10 being worst 0 being none. Pt also rated hopelessness at 2/10 on scale.  Pt denies any SI HI or AV Hallucinations. Pt is safe. Will con't to monitor.

## 2022-07-14 NOTE — [facility] Group Notes (Signed)
Pt attended [place] group. Pt was engaged and participated appropriately   Pt stated that she may participate in groups for people impacted by alcoholics support group.

## 2022-07-14 NOTE — BH IP Treatment Plan (Signed)
Interdisciplinary Treatment and Diagnostic Plan Update  07/14/2022 Time of Session: 10:50am Hannah Le MRN: [id number]  Principal Diagnosis: <principal problem not specified>  Secondary Diagnoses: Active Problems:   Generalized anxiety disorder with panic attacks   Bipolar I disorder, most recent episode (or current) manic (HCC)   Bulimia nervosa   Current Medications:  Current Facility-Administered Medications  Medication Dose Route Frequency Provider Last Rate Last Admin   acetaminophen (TYLENOL) tablet 650 mg  650 mg Oral Q6H PRN Dixon, Rashaun M, NP       alum & mag hydroxide-simeth (MAALOX/MYLANTA) 200-200-20 MG/5ML suspension 30 mL  30 mL Oral Q4H PRN Dixon, Rashaun M, NP       diphenhydrAMINE (BENADRYL) capsule 50 mg  50 mg Oral TID PRN Deloria Lair, NP       Or   diphenhydrAMINE (BENADRYL) injection 50 mg  50 mg Intramuscular TID PRN Deloria Lair, NP       OLANZapine (ZYPREXA) injection 5 mg  5 mg Intramuscular BID PRN Patrecia Pour, NP       And   diphenhydrAMINE (BENADRYL) injection 50 mg  50 mg Intramuscular BID PRN Patrecia Pour, NP       FLUoxetine (PROZAC) capsule 40 mg  40 mg Oral Daily Patrecia Pour, NP   40 mg at 07/14/22 0810   haloperidol (HALDOL) tablet 5 mg  5 mg Oral TID PRN Deloria Lair, NP       Or   haloperidol lactate (HALDOL) injection 5 mg  5 mg Intramuscular TID PRN Deloria Lair, NP       hydrOXYzine (ATARAX) tablet 25 mg  25 mg Oral TID PRN Deloria Lair, NP       loratadine (CLARITIN) tablet 10 mg  10 mg Oral Daily Patrecia Pour, NP   10 mg at 07/14/22 [id number]   LORazepam (ATIVAN) tablet 2 mg  2 mg Oral TID PRN Deloria Lair, NP       Or   LORazepam (ATIVAN) injection 2 mg  2 mg Intramuscular TID PRN Deloria Lair, NP       magnesium hydroxide (MILK OF MAGNESIA) suspension 30 mL  30 mL Oral Daily PRN Deloria Lair, NP       traZODone (DESYREL) tablet 50 mg  50 mg Oral QHS PRN Deloria Lair, NP   50 mg at 07/13/22  2126   PTA Medications: Medications Prior to Admission  Medication Sig Dispense Refill Last Dose   cetirizine (ZYRTEC) 10 MG tablet Take 10 mg by mouth daily.  5    dexmethylphenidate (FOCALIN XR) 15 MG 24 hr capsule Take 15 mg by mouth in the morning.      dexmethylphenidate (FOCALIN) 10 MG tablet Take 10 mg by mouth daily in the afternoon.      FLUoxetine (PROZAC) 40 MG capsule Take 40 mg by mouth daily.      Levonorgestrel (KYLEENA) 19.5 MG IUD 1 each (19.5 mg total) by Intrauterine route once for 1 dose. 1 Intra Uterine Device 0     Patient Stressors: Educational concerns   Financial difficulties   Health problems   Medication change or noncompliance   Substance abuse   Traumatic event    Patient Strengths: Average or above average intelligence  Capable of independent living  Communication skills  General fund of knowledge  Motivation for treatment/growth  Physical Health  Supportive family/friends   Treatment Modalities: Medication Management, Group therapy, Case management,  1 to 1 session with clinician, Psychoeducation, Recreational therapy.   Physician Treatment Plan for Primary Diagnosis: <principal problem not specified> Long Term Goal(s): Improvement in symptoms so as ready for discharge   Short Term Goals: Ability to identify changes in lifestyle to reduce recurrence of condition will improve Ability to verbalize feelings will improve  Medication Management: Evaluate patient's response, side effects, and tolerance of medication regimen.  Therapeutic Interventions: 1 to 1 sessions, Unit Group sessions and Medication administration.  Evaluation of Outcomes: Progressing  Physician Treatment Plan for Secondary Diagnosis: Active Problems:   Generalized anxiety disorder with panic attacks   Bipolar I disorder, most recent episode (or current) manic ([place])   Bulimia nervosa  Long Term Goal(s): Improvement in symptoms so as ready for discharge   Short Term Goals:  Ability to identify changes in lifestyle to reduce recurrence of condition will improve Ability to verbalize feelings will improve     Medication Management: Evaluate patient's response, side effects, and tolerance of medication regimen.  Therapeutic Interventions: 1 to 1 sessions, Unit Group sessions and Medication administration.  Evaluation of Outcomes: Progressing   RN Treatment Plan for Primary Diagnosis: <principal problem not specified> Long Term Goal(s): Knowledge of disease and therapeutic regimen to maintain health will improve  Short Term Goals: Ability to remain free from injury will improve, Ability to verbalize frustration and anger appropriately will improve, Ability to demonstrate self-control, Ability to participate in decision making will improve, Ability to verbalize feelings will improve, Ability to disclose and discuss suicidal ideas, Ability to identify and develop effective coping behaviors will improve, and Compliance with prescribed medications will improve  Medication Management: RN will administer medications as ordered by provider, will assess and evaluate patient's response and provide education to patient for prescribed medication. RN will report any adverse and/or side effects to prescribing provider.  Therapeutic Interventions: 1 on 1 counseling sessions, Psychoeducation, Medication administration, Evaluate responses to treatment, Monitor vital signs and CBGs as ordered, Perform/monitor CIWA, COWS, AIMS and Fall Risk screenings as ordered, Perform wound care treatments as ordered.  Evaluation of Outcomes: Progressing   LCSW Treatment Plan for Primary Diagnosis: <principal problem not specified> Long Term Goal(s): Safe transition to appropriate next level of care at discharge, Engage patient in therapeutic group addressing interpersonal concerns.  Short Term Goals: Engage patient in aftercare planning with referrals and resources, Increase social support,  Increase ability to appropriately verbalize feelings, Increase emotional regulation, Facilitate acceptance of mental health diagnosis and concerns, Facilitate patient progression through stages of change regarding substance use diagnoses and concerns, Identify triggers associated with mental health/substance abuse issues, and Increase skills for wellness and recovery  Therapeutic Interventions: Assess for all discharge needs, 1 to 1 time with Social worker, Explore available resources and support systems, Assess for adequacy in community support network, Educate family and significant other(s) on suicide prevention, Complete Psychosocial Assessment, Interpersonal group therapy.  Evaluation of Outcomes: Progressing   Progress in Treatment: Attending groups: Yes. Participating in groups: No. Taking medication as prescribed: Yes. Toleration medication: Yes. Family/Significant other contact made: No, will contact:  CSW will assess and identify social support Patient understands diagnosis: Yes. Discussing patient identified problems/goals with staff: Yes. Medical problems stabilized or resolved: No. Patient has suspected eating disorder Denies suicidal/homicidal ideation: Yes. Issues/concerns per patient self-inventory: No.   New problem(s) identified: No, Describe:  none reported   New Short Term/Long Term Goal(s):    medication stabilization, elimination of SI thoughts, development of comprehensive mental wellness plan.  Patient Goals:  Pt states, "I need help coping, I lost my grandmother and it was really hard for me"  Discharge Plan or Barriers: Patient recently admitted. CSW will continue to follow and assess for appropriate referrals and possible discharge planning.    Reason for Continuation of Hospitalization: Anxiety Depression Medication stabilization Suicidal ideation  Estimated Length of Stay: 3-5 days  Last 3 [place] Suicide Severity Risk Score: [place]  Admission (Current) from 07/13/2022 in [facility] ED from 07/12/2022 in [facility] Department at [facility] Error: Q3, 4, or 5 should not be populated when Q2 is No Low Risk       Last PHQ 2/9 Scores:     No data to display          Scribe for Treatment Team: Zachery Conch, LCSW 07/14/2022 1:04 PM

## 2022-07-14 NOTE — Plan of Care (Signed)
  Problem: Education: Goal: Ability to state activities that reduce stress will improve Outcome: Progressing   Problem: Education: Goal: Utilization of techniques to improve thought processes will improve Outcome: Progressing   Problem: Education: Goal: Knowledge of [place] General Education information/materials will improve Outcome: Progressing   Problem: Education: Goal: Knowledge of disease or condition will improve Outcome: Progressing Goal: Understanding of discharge needs will improve Outcome: Progressing   Problem: Education: Goal: Ability to make informed decisions regarding treatment will improve Outcome: Progressing   Problem: Education: Goal: Knowledge of the prescribed therapeutic regimen will improve Outcome: Progressing
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
## 2022-07-14 NOTE — Group Note (Signed)
Date:  07/14/2022 Time:  10:56 AM  Group Topic/Focus:  Orientation:   The focus of this group is to educate the patient on the purpose and policies of crisis stabilization and provide a format to answer questions about their admission.  The group details unit policies and expectations of patients while admitted.   Active Participation Level:    Participation Quality:  Appropriate  Affect:  Appropriate  Cognitive:  Appropriate  Insight: Appropriate  Engagement in Group:  Engaged  Modes of Intervention:  Discussion  Additional Comments:     Jerrye Beavers 07/14/2022, 10:56 AM

## 2022-07-14 NOTE — H&P (Addendum)
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 stabilization Start trazodone 50 mg nightly as needed for insomnia Discontinue Prozac -- The risks/benefits/side-effects/alternatives to this medication were discussed in detail with the patient and time was given for questions. The patient consents to medication trial.              -- Metabolic profile and EKG monitoring obtained while on an  atypical antipsychotic  BMI: 19.75 kg/m TSH: Pending Lipid Panel: Pending HbgA1c: Pending QTc: Pending             -- Encouraged patient to participate in unit milieu and in scheduled group therapies   -- Short Term Goals: Ability to identify changes in lifestyle to reduce recurrence of condition will improve and Ability to verbalize feelings will improve  -- Long Term Goals: Improvement in symptoms so as ready for discharge    3. Medical Issues Being Addressed:   Tobacco Use Disorder  -- Nicotine patch 21mg /24 hours ordered  -- Smoking cessation encouraged  Constipation -- Start MiraLAX daily as needed  4. Discharge Planning:   -- Social work and case management to assist with discharge planning and identification of hospital follow-up needs prior to discharge  -- Estimated LOS: 5-7 days  -- Discharge Concerns: Need to establish a safety plan; Medication compliance and effectiveness  -- Discharge Goals: Return home with outpatient referrals for mental health follow-up including medication management/psychotherapy   I certify that inpatient services furnished can reasonably be expected to improve the patient's condition.    Signed: Dr. Jacques Navy, MD PGY-1, Psychiatry Residency  3/25/20241:26 PM

## 2022-07-15 LAB — LIPID PANEL
Cholesterol: 144 mg/dL (ref 0–200)
HDL: 46 mg/dL (ref >40)
LDL Cholesterol: 81 mg/dL (ref 0–99)
Total CHOL/HDL Ratio: 3.1 RATIO
Triglycerides: 84 mg/dL (ref <150)
VLDL: 17 mg/dL (ref 0–40)

## 2022-07-15 LAB — TSH: TSH: 1.574 u[IU]/mL (ref 0.350–4.500)

## 2022-07-15 NOTE — [facility] Group Notes (Signed)
Spiritual care group on grief and loss facilitated by Chaplain Katy Addie Cederberg, [facility] and Colleen Pesci, counseling intern.  Group Goal: Support / Education around grief and loss  Members engage in facilitated group support and psycho-social education.  Group Description:  Following introductions and group rules, group members engaged in facilitated group dialogue and support around topic of loss, with particular support around experiences of loss in their lives. Group Identified types of loss (relationships / self / things) and identified patterns, circumstances, and changes that precipitate losses. Reflected on thoughts / feelings around loss, normalized grief responses, and recognized variety in grief experience. Group encouraged individual reflection on safe space and on the coping skills that they are already utilizing.  Group drew on Adlerian / Rogerian and narrative framework  Patient Progress: Did not attend.  

## 2022-07-15 NOTE — Plan of Care (Signed)
  Problem: Education: Goal: Utilization of techniques to improve thought processes will improve Outcome: Progressing   Problem: Education: Goal: Ability to make informed decisions regarding treatment will improve Outcome: Progressing

## 2022-07-15 NOTE — Group Note (Signed)
Date:  07/15/2022 Time:  10:12 AM  Group Topic/Focus:  Goals Group:   The focus of this group is to help patients establish daily goals to achieve during treatment and discuss how the patient can incorporate goal setting into their daily lives to aide in recovery.    Participation Level:  Active  Participation Quality:  Appropriate  Affect:  Appropriate  Cognitive:  Appropriate  Insight: Appropriate  Engagement in Group:  Engaged  Modes of Intervention:  Discussion and Education  Additional Comments:   Pt attended and actively participated in the Orientation/Goals group. Pt personal goal is to be more positive, discharge soon and to get her medications right.  Wetzel Bjornstad Bellamy Rubey 07/15/2022, 10:12 AM
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
## 2022-07-15 NOTE — Progress Notes (Addendum)
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  Admitted voluntarily to [facility] on 07/13/2022.  She reports an extensive family history of bipolar disorder.  (admitted on 07/13/2022, total  LOS: 2 days )  Yesterday, the psychiatry team made following recommendations:  Start Abilify 5 mg daily for mood stabilization Start trazodone 50 mg nightly as needed for insomnia Discontinue Prozac  Information Obtained Today During Patient Interview:  Patient evaluated bedside, reports doing well.  Describes her mood today as "calm".  Reports feeling like her thoughts are more organized compared to admission.  Rates her depression and anxiety as 0/10, where 10 is worse.  Endorses adequate sleep and appetite.  Reports attending group.  On assessment, patient denies suicidal ideation, denies homicidal ideation.  She denies auditory and visual hallucinations.  She denies thought insertion, thought withdrawal, and ideas of reference.  Denies any paranoid ideations  Patient denies any side effects to currently prescribed psychiatric medications.  Continues to report some constipation, otherwise no other somatic complaints.   Past Psychiatric Hx: Current Psychiatrist: Denies Current Therapist: Denies Previous Psych  Diagnoses Reports she has previously been diagnosed with depression, anxiety, and ADHD Current psychiatric medications Patient reports she is compliant on all her psychiatric medications, denies any abuse. Adderall 15 mg XL daily for ADHD Adderall 10 mg IR as needed daily for ADHD Prozac 40 mg daily for depression Psychiatric medication history: None except as described above. Prior inpatient treatment: Denies Current/prior outpatient treatment: Denies Prior rehab hx: Denies Psychotherapy hx: Denies History of suicide: Denies History of homicide or aggression: as described in the HPI Psychiatric medication compliance history: Neuromodulation history:      Substance Abuse Hx: Alcohol: Reports drinking 3-4 times a week to the point of intoxication.  Reports last drink was 1 week ago.  Reports drinking has worsened since the death of her grandmother a month ago. Tobacco: Reports she vapes and smokes cigarettes socially, began to smoke at the age of 61. Illicit drugs:  Cannabis: Smokes nightly for anxiety, started 2 years ago.  Last use a week ago. Rx drug abuse: Denies Rehab hx: Denies Past Medical History: PCP: Sees Dr. Wynelle Le in [place]  Dx: Depression, anxiety, and ADHD Meds: Denies ALL: Anaphylactic shock to penicillin and amoxicillin.  Morphine caused cardiac arrest Hosp: Denies Surgeries: Tonsillectomy at age 37, wisdom teeth removal at age 6 Trauma: Reports 3 previous concussions, was treated for concussion 2020. Seizures: Denies   LMP: Reports irregular menstrual period's due to IUD Contraceptives: IUD, placed 3 years ago. Past Medical History:  Past Medical History:  Diagnosis Date   Concussion    childhood concussion    Depression    Migraine with aura    Family History:  Family History  Problem Relation Age of Onset   Hypertension Paternal Grandmother  Family Psychiatric History: Mother: Bipolar disorder Father: Deceased age 27, alcohol use  disorder Other relatives: Reports history of alcohol and drug use on both sides of her family.  Maternal grandmother died from an overdose. Suicide: No reported history of suicide in the family   Family History: Mother: Murmur, sciatica, vertigo, multiple strokes, cancer in remission Father: Deceased age 72 due to MI Other relatives: Reports history of cancer on her mother side, maternal grandmother and grandfather had cancer.  Reports her brother has brain tumors (age 48 years old)   Social History: Living: Reports being in [place], recently rented an apartment to live on her own.  Before that she lives with roommates. 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  She is also part of the cheer team at the [employer]. Work: Works as a Engineer, manufacturing systems. Finances: Reports that she has a large inheritance, has "lots of money" Marital Status: Single Children: Denies   Abuse: None except as detailed in HPI Legal: Reports a charge related to fake ID in October 2023, suspects that she missed her court date related to this. Military: Denies  Current Medications: Current Facility-Administered Medications  Medication Dose Route Frequency Provider Last Rate Last Admin   acetaminophen (TYLENOL) tablet 650 mg  650 mg Oral Q6H PRN Dixon, Rashaun Le, [place]       alum & mag hydroxide-simeth (MAALOX/MYLANTA) 200-200-20 MG/5ML suspension 30 mL  30 mL Oral Q4H PRN Dixon, Rashaun Le, Le       ARIPiprazole (ABILIFY) tablet 5 mg  5 mg Oral Daily [facility]   5 mg at 07/15/22 [facility]   diphenhydrAMINE (BENADRYL) capsule 50 mg  50 mg Oral TID PRN [facility] Le       Or   diphenhydrAMINE (BENADRYL) injection 50 mg  50 mg Intramuscular TID PRN [facility]       haloperidol (HALDOL) tablet 5 mg  5 mg Oral TID PRN Hannah Le       Or   haloperidol lactate (HALDOL) injection 5 mg  5 mg Intramuscular TID PRN [facility]        hydrOXYzine (ATARAX) tablet 25 mg  25 mg Oral TID PRN [facility] Le       loratadine (CLARITIN) tablet 10 mg  10 mg Oral Daily Patrecia Pour, Le   10 mg at 07/15/22 [facility]   LORazepam (ATIVAN) tablet 2 mg  2 mg Oral TID PRN [facility]       Or   LORazepam (ATIVAN) injection 2 mg  2 mg Intramuscular TID PRN [facility]       magnesium hydroxide (MILK OF MAGNESIA) suspension 30 mL  30 mL Oral Daily PRN Dixon, Rashaun Le, Le       polyethylene glycol (MIRALAX / GLYCOLAX) packet 17 g  17 g Oral Daily PRN Carrion-Carrero, Caryl Ada, Le       traZODone (DESYREL) tablet 50 mg  50 mg Oral QHS PRN [facility]   50 mg at 07/14/22 2053    Lab Results:  Results for orders placed or performed during the hospital encounter of 07/13/22 (from the past 48 hour(s))  TSH     Status: None   Collection Time: 07/15/22  6:27 AM  Result Value Ref Range   TSH 1.574 0.350 - 4.500 uIU/mL    Comment: Performed by a 3rd Generation assay with a functional sensitivity of <=0.01 uIU/mL.  Performed at [facility], [place] [address], [place], [place] [address]   Lipid panel     Status: None   Collection Time: 07/15/22  6:27 AM  Result Value Ref Range   Cholesterol 144 0 - 200 mg/dL   Triglycerides 84 <150 mg/dL   HDL 46 >40 mg/dL   Total CHOL/HDL Ratio 3.1 RATIO   VLDL 17 0 - 40 mg/dL   LDL Cholesterol 81 0 - 99 mg/dL    Comment:        Total Cholesterol/HDL:CHD Risk Coronary Heart Disease Risk Table                     Men   Women  1/2 Average Risk   3.4   3.3  Average Risk       5.0   4.4  2 X Average Risk   9.6   7.1  3 X Average Risk  23.4   11.0        Use the calculated Patient Ratio above and the CHD Risk Table to determine the patient's CHD Risk.        ATP III CLASSIFICATION (LDL):  <100     mg/dL   Optimal  100-129  mg/dL   Near or Above                    Optimal  130-159  mg/dL   Borderline  160-189  mg/dL   High  >190     mg/dL   Very  High Performed at [facility] [address], [place], [place] [address]     Blood Alcohol level:  Lab Results  Component Value Date   ETH <10 99991111    Metabolic Labs: No results found for: "HGBA1C", "MPG" No results found for: "PROLACTIN" Lab Results  Component Value Date   CHOL 144 07/15/2022   TRIG 84 07/15/2022   HDL 46 07/15/2022   CHOLHDL 3.1 07/15/2022   VLDL 17 07/15/2022   LDLCALC 81 07/15/2022    Sleep:Sleep: Good   Physical Findings: AIMS: No  CIWA:    COWS:     Psychiatric Specialty Exam:  Presentation  General Appearance: Appropriate for Environment; Casual; Fairly Groomed  Eye Contact:Fair  Speech:Clear and Coherent; Normal Rate  Speech Volume:Normal  Handedness:Right   Mood and Affect  Mood:Euthymic  Affect:Appropriate; Full Range; Congruent   Thought Process  Thought Processes:Coherent; Goal Directed; Linear  Descriptions of Associations:Intact  Orientation:Full (Time, Place and Person)  Thought Content:Logical; WDL  History of Schizophrenia/Schizoaffective disorder:No data recorded Duration of Psychotic Symptoms:No data recorded Hallucinations:Hallucinations: None  Ideas of Reference:None  Suicidal Thoughts:Suicidal Thoughts: No  Homicidal Thoughts:Homicidal Thoughts: No   Sensorium  Memory:Immediate Fair  Judgment:Fair  Insight:Fair   Executive Functions  Concentration:Good  Attention Span:Fair  [place]   Psychomotor Activity  Psychomotor Activity:Psychomotor Activity: Normal   Assets  Assets:Communication Skills; Desire for Improvement; Resilience   Sleep  Sleep:Sleep: Good    Physical Exam: Physical Exam Vitals and nursing note reviewed.  Constitutional:      General: She is not in acute distress.    Appearance: Normal appearance. She is not ill-appearing.  HENT:     Head: Normocephalic and atraumatic.  Pulmonary:      Effort: Pulmonary effort is normal. No respiratory distress.  Musculoskeletal:        General: Normal range of motion.  Skin:    General: Skin is  warm and dry.  Neurological:     Mental Status: She is alert.    Review of Systems  Respiratory:  Negative for shortness of breath.   Cardiovascular:  Negative for chest pain.  Gastrointestinal:  Positive for abdominal pain and constipation. Negative for diarrhea.  Psychiatric/Behavioral:  Negative for depression, hallucinations, memory loss, substance abuse and suicidal ideas. The patient is not nervous/anxious and does not have insomnia.    Blood pressure 93/66, pulse 62, temperature 98.1 F (36.7 C), temperature source Oral, resp. rate 16, height 5\' 2"  (1.575 Le), weight 49 kg, last menstrual period 07/13/2022, SpO2 100 %. Body mass index is 19.75 kg/Le.  Treatment Plan Summary: Daily contact with patient to assess and evaluate symptoms and progress in treatment and Medication management   ASSESSMENT: [facility], Electronics engineer, with a PPHx  depression, anxiety, and PTSD, presenting voluntarily to [facility] for worsening depression and acte distress in the setting of multiple social stressors including the recent death of her grandmother. She reports a family history of bipolar disorder.      Diagnoses / Active Problems: Bipolar 1 Disorder, current episode manic GAD with panic attacks Bulimia nervosa PTSD Alcohol use disorder Cannabis use disorder Hx of diagnosed ADHD   PLAN: Safety and Monitoring:              -- VOLUNTARY admission to inpatient psychiatric unit for safety, stabilization and treatment             -- Daily contact with patient to assess and evaluate symptoms and progress in treatment             -- Patient's case to be discussed in multi-disciplinary team meeting             -- Observation Level : q15 minute checks             -- Vital signs:  q12 hours             --  Precautions: suicide, elopement, and assault   2. Psychiatric Diagnoses and Treatment:  Continue Abilify 5 mg daily for mood stabilization Continue Trazodone 50 mg nightly as needed for insomnia -- The risks/benefits/side-effects/alternatives to this medication were discussed in detail with the patient and time was given for questions. The patient consents to medication trial.              -- Metabolic profile and EKG monitoring obtained while on an atypical antipsychotic  BMI: 19.75 kg/Le TSH: Pending Lipid Panel: WNL HbgA1c: Pending QTc: 415             -- Encouraged patient to participate in unit milieu and in scheduled group therapies              -- Short Term Goals: Ability to identify changes in lifestyle to reduce recurrence of condition will improve and Ability to verbalize feelings will improve             -- Long Term Goals: Improvement in symptoms so as ready for discharge                3. Medical Issues Being Addressed:              Tobacco Use Disorder             -- Nicotine patch 21mg /24 hours ordered             -- Smoking cessation encouraged  Constipation -- Continue MiraLAX daily as needed   4. Discharge Planning:              -- Social work and case management to assist with discharge planning and identification of hospital follow-up needs prior to discharge             -- Estimated LOS: Thurs 3/28, @ 9AM             -- Discharge Concerns: Need to establish a safety plan; Medication compliance and effectiveness             -- Discharge Goals: Return home with outpatient referrals for mental health follow-up including medication management/psychotherapy     I certify that inpatient services furnished can reasonably be expected to improve the patient's condition.    Dr. Jacques Navy, [place] PGY-1, Psychiatry Residency  3/26/20242:25 PM Continue continue continue continue

## 2022-07-15 NOTE — Progress Notes (Signed)
D: When asked about her day patient informed that she was depressed. Stated, I've been depressed since my dad died 2 years ago". Pt later informed that her grandmother and grandfather died within the last 29 months. Pt signed a 72 hr req for discharge earlier today and informed that she plans to live with her mother in [place] for 2 weeks then go back to her apt in [place] where she attends [employer].  A:  Support and encouragement was offered. 15 min checks continued for safety.  R: Pt remains safe.

## 2022-07-15 NOTE — Group Note (Signed)
Date:  07/15/2022 Time:  1:53 PM  Group Topic/Focus:  Peer Support    Participation Level:  Did Not Attend  Participation Quality:   n/a  Affect:   n/a  Cognitive:   n/a  Insight: None  Engagement in Group:   n/a  Modes of Intervention:   n/a  Additional Comments:   Did not attend.  Wetzel Bjornstad Tais Koestner 07/15/2022, 1:53 PM

## 2022-07-15 NOTE — Progress Notes (Signed)
  On assessment, patient presents with mild anxiety.  Patient reports she is anxious to go home.  Patient denies depression and pain.  Patient further reports no SI/HI/AVH, and contracts for safety.  Patient is safe on the unit with q15 minute safety checks.   07/15/22 2054  Psych Admission Type (Psych Patients Only)  Admission Status Voluntary  Psychosocial Assessment  Patient Complaints None  Eye Contact Fair  Facial Expression Animated  Affect Anxious  Speech Logical/coherent  Interaction Assertive  Motor Activity Other (Comment) (WDL)  Appearance/Hygiene Unremarkable  Behavior Characteristics Cooperative;Appropriate to situation  Mood Pleasant;Anxious  Thought Process  Coherency WDL  Content WDL  Delusions None reported or observed  Perception WDL  Hallucination None reported or observed  Judgment Limited  Confusion None  Danger to Self  Current suicidal ideation? Denies  Agreement Not to Harm Self Yes  Description of Agreement verbal  Danger to Others  Danger to Others None reported or observed

## 2022-07-15 NOTE — Progress Notes (Signed)
Patient cooperative and med compliant. Patient denies SI,HI, and A/V/H with no plan or intent. Patient states feeling more awake today and better overall. Patient denies any pain or discomfort. Patient socializes appropriately on unit. No s/s of current distress.   07/15/22 0840  Psych Admission Type (Psych Patients Only)  Admission Status Voluntary  Psychosocial Assessment  Patient Complaints Depression  Eye Contact Fair  Facial Expression Animated  Affect Appropriate to circumstance  Speech Logical/coherent  Interaction Assertive  Motor Activity Other (Comment) (WDL)  Appearance/Hygiene Unremarkable  Behavior Characteristics Cooperative;Appropriate to situation  Mood Depressed;Pleasant  Thought Process  Coherency WDL  Content WDL  Delusions None reported or observed  Perception WDL  Hallucination None reported or observed  Judgment Limited  Confusion None  Danger to Self  Current suicidal ideation? Denies  Agreement Not to Harm Self Yes  Description of Agreement verbally contracts for safety  Danger to Others  Danger to Others None reported or observed

## 2022-07-15 NOTE — [facility] Suicide Risk Assessment (Signed)
[place] INPATIENT:  Family/Significant Other Suicide Prevention Education  Suicide Prevention Education:  Education Completed; 07-15-2022,  has been identified by the patient as the family member/significant other with whom the patient will be residing, and identified as the person(s) who will aid the patient in the event of a mental health crisis (suicidal ideations/suicide attempt).  With written consent from the patient, the family member/significant other has been provided the following suicide prevention education, prior to the and/or following the discharge of the patient.  The suicide prevention education provided includes the following: Suicide risk factors Suicide prevention and interventions National Suicide Hotline telephone number [facility] assessment telephone number [facility] Emergency Assistance [place] and/or Residential Mobile Crisis Unit telephone number  Request made of family/significant other to: Remove weapons (e.g., guns, rifles, knives), all items previously/currently identified as safety concern.   Remove drugs/medications (over-the-counter, prescriptions, illicit drugs), all items previously/currently identified as a safety concern.  The family member/significant other verbalizes understanding of the suicide prevention education information provided.  The family member/significant other agrees to remove the items of safety concern listed above.  Latacha Texeira S Elfrida Pixley 07/15/2022, 1:47 PM

## 2022-07-15 NOTE — Progress Notes (Signed)
The patient rated her day as a 9 out of 10 and anticipates being discharged tomorrow or Thursday.

## 2022-07-15 NOTE — Group Note (Signed)
Recreation Therapy Group Note   Group Topic:Animal Assisted Therapy   Group Date: 07/15/2022 Start Time: [id number] End Time: 1030 Facilitators: Adoria Kawamoto-McCall, LRT,CTRS Location: [address] Dayroom   Animal-Assisted Activity (AAA) Program Checklist/Progress Notes Patient Eligibility Criteria Checklist & Daily Group note for Rec Tx Intervention  AAA/T Program Assumption of Risk Form signed by Patient/ or Parent Legal Guardian Yes  Patient understands his/her participation is voluntary Yes   Affect/Mood: N/A   Participation Level: Did not attend    Clinical Observations/Individualized Feedback:    Plan: Continue to engage patient in RT group sessions 2-3x/week.   Mistey Hoffert-McCall, LRT,CTRS 07/15/2022 1:44 PM

## 2022-07-15 NOTE — Plan of Care (Signed)
  Problem: Education: Goal: Ability to state activities that reduce stress will improve Outcome: Progressing   Problem: Education: Goal: Understanding of discharge needs will improve Outcome: Progressing

## 2022-07-15 NOTE — Plan of Care (Signed)
  Problem: Education: Goal: Utilization of techniques to improve thought processes will improve Outcome: Progressing   

## 2022-07-16 DIAGNOSIS — F3110 Bipolar disorder, current episode manic without psychotic features, unspecified: Secondary | ICD-10-CM

## 2022-07-16 LAB — HEMOGLOBIN A1C
Hgb A1c MFr Bld: 5.4 % (ref 4.8–5.6)
Mean Plasma Glucose: 108 mg/dL

## 2022-07-16 MED ORDER — ARIPIPRAZOLE 5 MG PO TABS: 5.0000 mg | ORAL_TABLET | Freq: Every day | ORAL | 0 refills | Status: AC

## 2022-07-16 NOTE — [facility] Group Notes (Signed)
[place] Group Notes:  (Nursing/MHT/Case Management/Adjunct)  Date:  07/16/2022  Time:  9:44 AM  Type of Therapy:  Group Therapy  Participation Level:  Active  Participation Quality:  Appropriate  Affect:  Appropriate  Cognitive:  Appropriate  Insight:  Appropriate  Engagement in Group:  Engaged  Modes of Intervention:  Discussion  Summary of Progress/Problems:  Patient attended and participated in a goals/ orientation group today.   Elza Rafter 07/16/2022, 9:44 AM
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
## 2022-07-16 NOTE — Progress Notes (Signed)
  [facility] Adult Case Management Discharge Plan :  Will you be returning to the same living situation after discharge:  No. [phone] Waymon Budge* (Mother) At discharge, do you have transportation home?: Yes,  Friend Do you have the ability to pay for your medications: Yes,  Insured  Release of information consent forms completed and in the chart;  Patient's signature needed at discharge.  Patient to Follow up at:  Follow-up Information     [facility]. Schedule an appointment as soon as possible for a visit.   Specialty: Hospice and Palliative Medicine Why: Please call this provider personally to schedule an appointment for grief/bereavement therapy services. Contact information: [place] Follow up.   Why: You have a hospital follow up appointment on    This appointment will be held in person.  Following this appointment, you will be scheduled for a clinical assessment to obtain necessary therapy and medication management services. Contact information: [place] [address] [phone]                 Next level of care provider has access to [place] and Suicide Prevention discussed: Yes,  [phone] Waymon Budge* (Mother)     Has patient been referred to the Quitline?: N/A patient is not a smoker  Patient has been referred for addiction treatment: Yes Patient to continue working towards treatment goals after discharge. Patient no longer meets criteria for inpatient criteria per attending physician. Continue taking medications as prescribed, nursing to provide instructions at discharge. Follow up with all scheduled appointments.   Nicolle Heward S Naveh Rickles, LCSW 07/16/2022, 10:04 AM

## 2022-07-16 NOTE — Progress Notes (Signed)
Patient is discharging at this time. Patient is A&Ox4. Vs stable. Patient denies SI,HI, and A/V/H with no plan/intent. Printed AVS reviewed with and given to patient along with medications and follow up appointments. Patient suicide safety plan complete with copy provided to patient. Patient verbalized all understanding. All valuables/belongings returned to patient. Patient is being transported by her friend. Patient denies any pain/discomfort. No s/s of current distress.

## 2022-07-16 NOTE — Discharge Instructions (Addendum)
-  Follow-up with your outpatient psychiatric provider -instructions on appointment date, time, and address (location) are provided to you in discharge paperwork.  -Take your psychiatric medications as prescribed at discharge - instructions are provided to you in the discharge paperwork  STOP PROZAC CONTINUE [place] WAIT TO SEE OUTPATIENT PSYCHIATRIST BEFORE RESTARTING ANY STIMULANT  -Follow-up with outpatient primary care doctor and other specialists -for management of preventative medicine and any chronic medical disease.  -Recommend abstinence from alcohol, tobacco, and other illicit drug use at discharge.   -If your psychiatric symptoms recur, worsen, or if you have side effects to your psychiatric medications, call your outpatient psychiatric provider, 911, 988 or go to the nearest emergency department.  -If suicidal thoughts occur, call your outpatient psychiatric provider, 911, 988 or go to the nearest emergency department.  Naloxone (Narcan) can help reverse an overdose when given to the victim quickly.  [facility] offers free naloxone kits and instructions/training on its use.  Add naloxone to your first aid kit and you can help save a life.   Pick up your free kit at the following locations:   [place]:  [place], [place] [address] [phone]) [facility] [place] [id number] [phone]) [facility] [place]  [place]: [place] [id number] [place] [id number] [phone]) [facility] [place] [address] [phone])

## 2022-07-16 NOTE — Progress Notes (Signed)
   07/16/22 0533  15 Minute Checks  Location Bedroom  Visual Appearance Calm  Behavior Sleeping  Sleep (Behavioral Health Patients Only)  Calculate sleep? (Click Yes once per 24 hr at 0600 safety check) Yes  Documented sleep last 24 hours 8.75

## 2024-04-21 IMAGING — US US BREAST*R* LIMITED INC AXILLA
1 series · 5 of 5 positions shown · non-contrast
Comparison: None Available.

CLINICAL DATA: Multiple palpable areas in the RIGHT breast. One
palpable area in the LEFT breast. Strong family history of breast
cancer on the maternal side in second-degree relatives.

EXAM:
ULTRASOUND OF THE RIGHT BREAST
ULTRASOUND OF THE LEFT BREAST

[Series 1: us breast*right* limited inc axilla · 0.07mm/px · 5 of 5 slices shown]
[im 1/5]
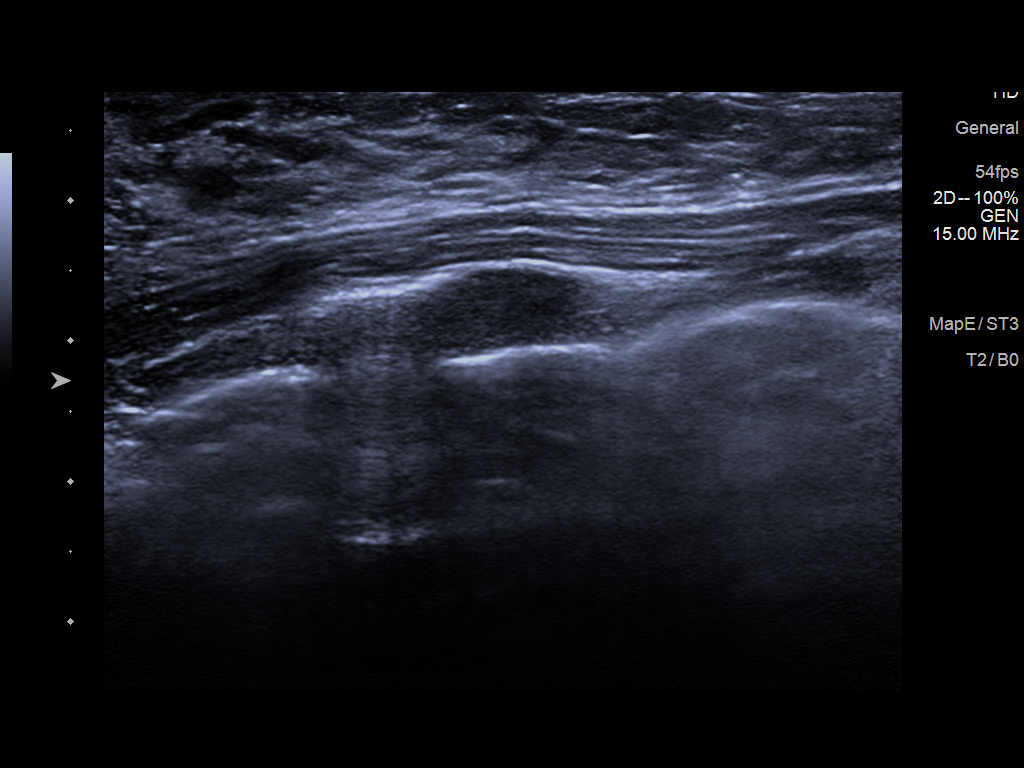
[im 2/5]
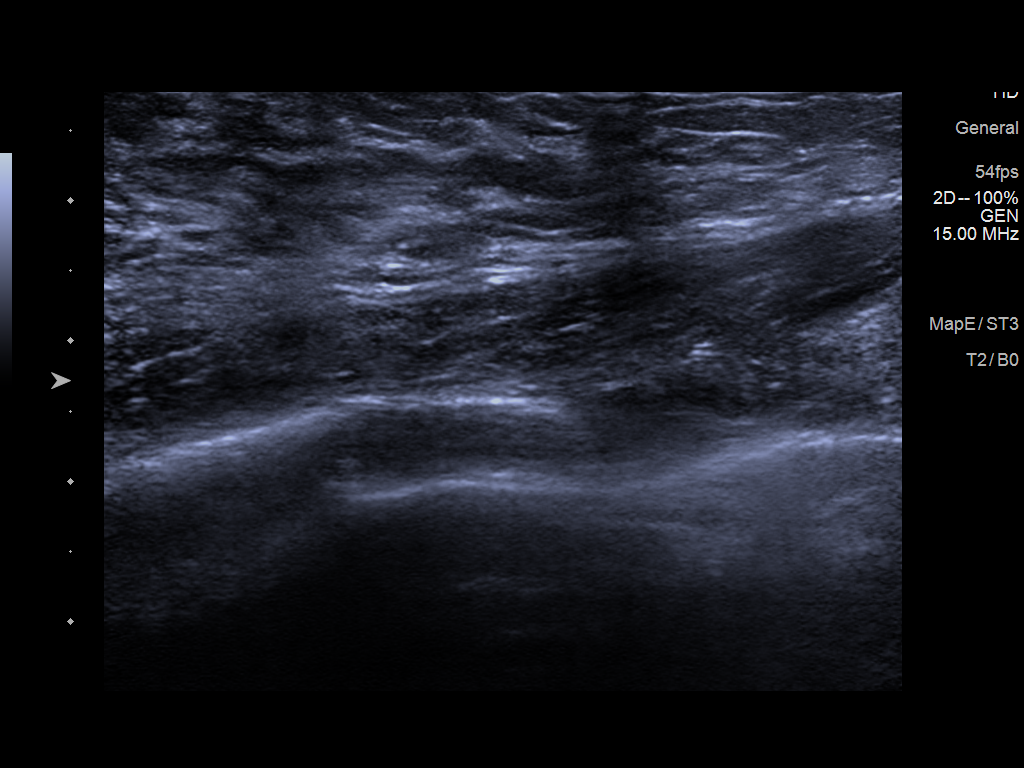
[im 3/5]
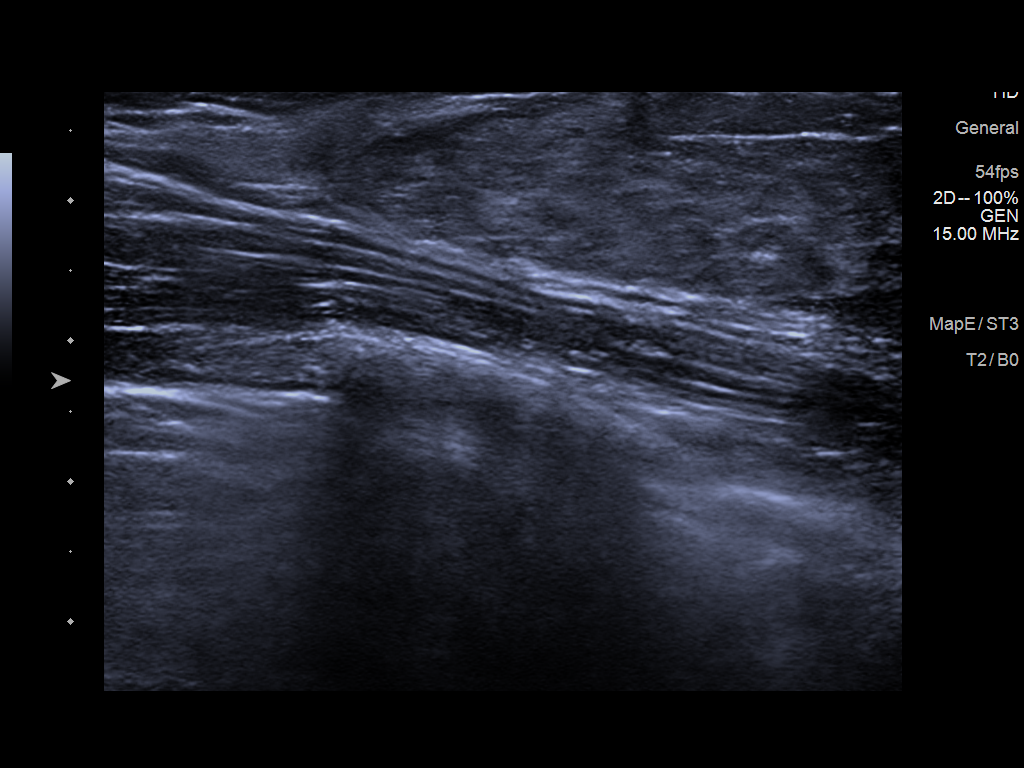
[im 4/5]
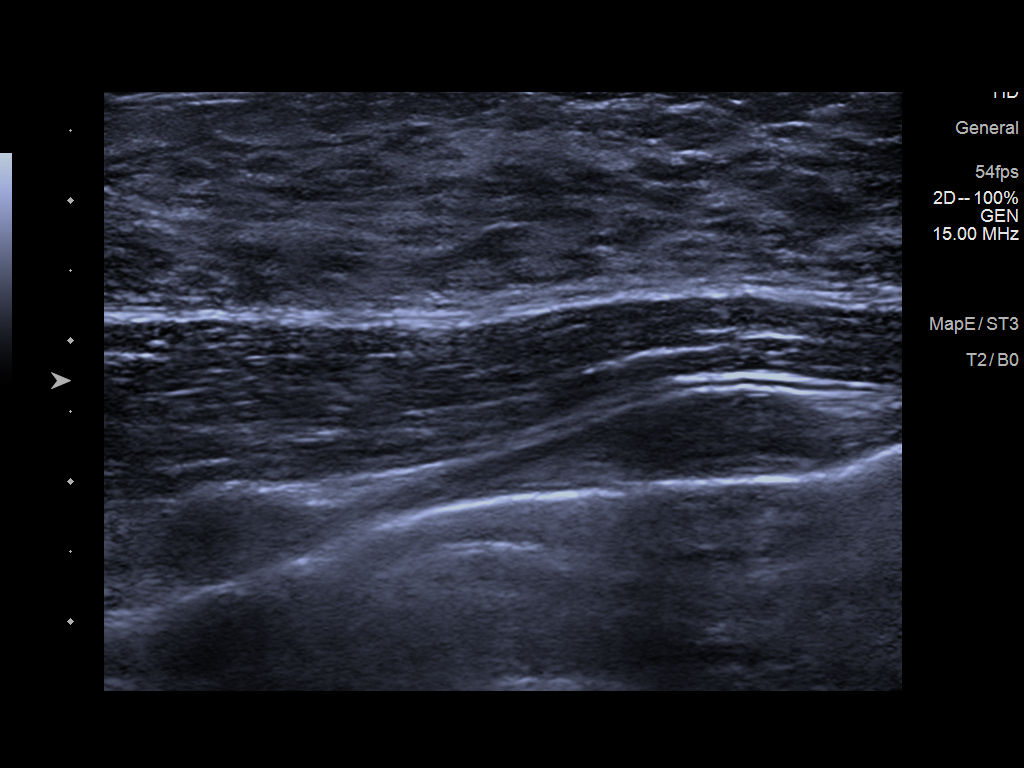
[im 5/5]
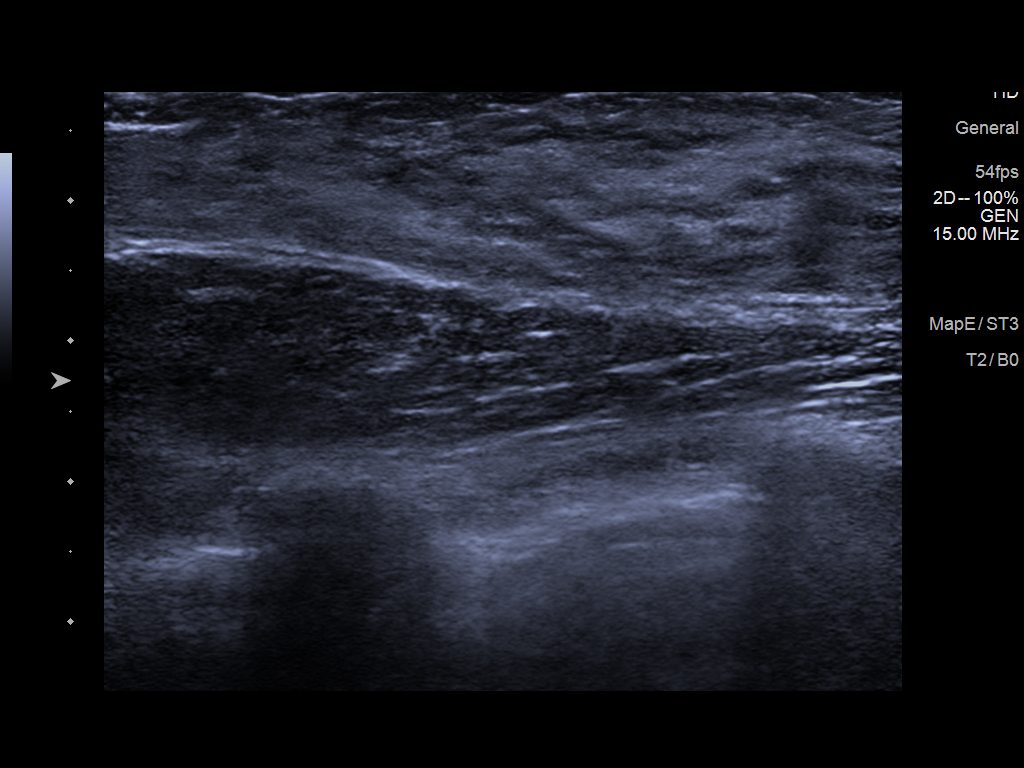

[5 of 5 positions shown; findings below may reference images not displayed]

FINDINGS: On physical exam, there are firm ridges of tissue appreciated
bilaterally at the sites of palpable concern.

Targeted ultrasound was performed of the sites of palpable concern
throughout the RIGHT upper breast and RIGHT lower inner breast. No
suspicious cystic or solid mass is seen. Dense fibroglandular tissue
is noted throughout.

Targeted ultrasound was performed of the site of palpable concern in
the LEFT upper outer breast. No suspicious cystic or solid mass is
seen. Dense fibroglandular tissue is noted.
IMPRESSION: No sonographic evidence of malignancy at the sites of palpable
concern bilaterally. Any further workup of the patient's symptoms
should be based on the clinical assessment.

RECOMMENDATION:
Recommend clinical evaluation for assessment of lifetime risk of
breast cancer by the age of 25 given strong family history of breast
cancer in a maternal side.

If patient is of elevated lifetime risk of breast cancer, recommend
initiation of annual screening mammography at the age of 30 and
consideration of annual screening MRI at the age of 25. The American
Cancer Society recommends annual MRI and mammography in patients
with an estimated lifetime risk of developing breast cancer greater
than 20 - 25%, or who are known or suspected to be positive for the
breast cancer gene.

If patient is of average lifetime risk of breast cancer, recommend
annual screening mammogram at the age of 40 unless there are
intervening clinical concerns.

I have discussed the findings and recommendations with the patient.
If applicable, a reminder letter will be sent to the patient
regarding the next appointment.

BI-RADS CATEGORY  1: Negative.

## 2024-04-21 IMAGING — US US BREAST*L* LIMITED INC AXILLA
1 series · 2 of 2 positions shown · non-contrast
Comparison: None Available.

CLINICAL DATA: Multiple palpable areas in the RIGHT breast. One
palpable area in the LEFT breast. Strong family history of breast
cancer on the maternal side in second-degree relatives.

EXAM:
ULTRASOUND OF THE RIGHT BREAST
ULTRASOUND OF THE LEFT BREAST

[Series 1: us breast*left* limited inc axilla · 0.07mm/px · 2 of 2 slices shown]
[im 1/2]
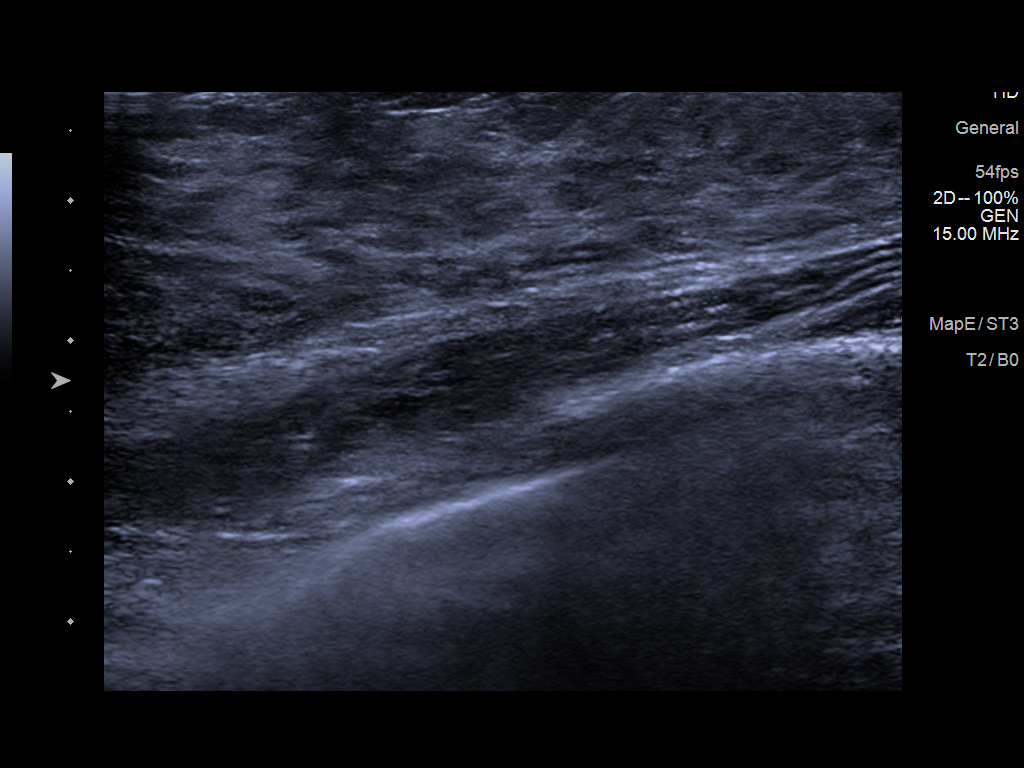
[im 2/2]
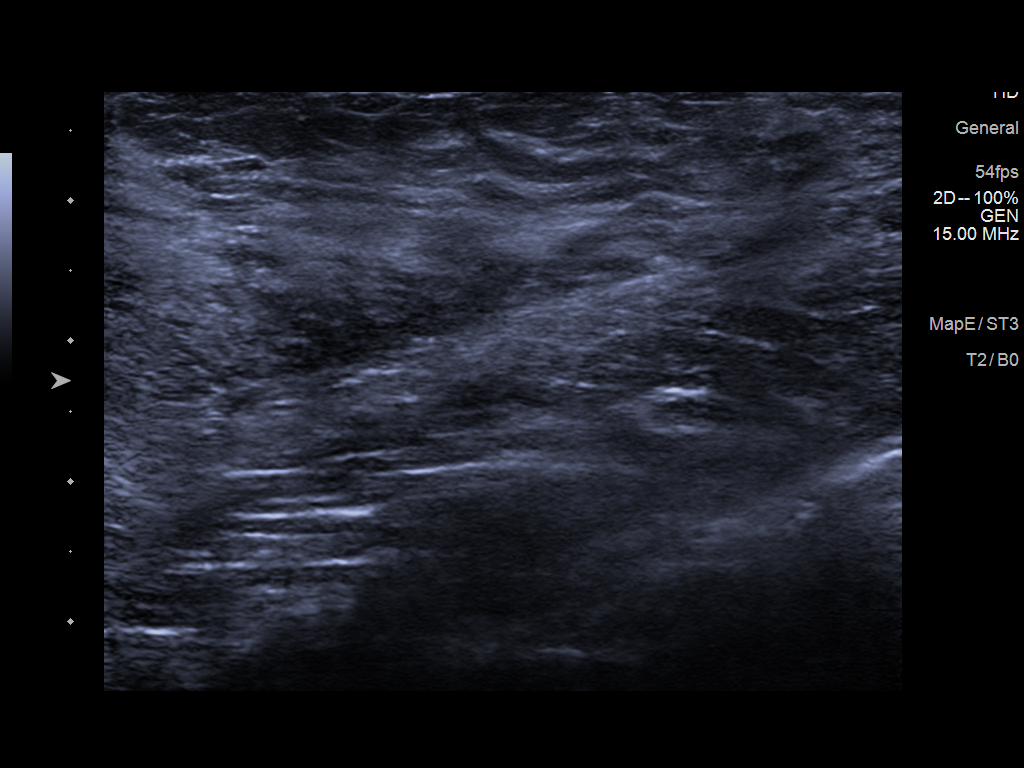

[2 of 2 positions shown; findings below may reference images not displayed]

FINDINGS: On physical exam, there are firm ridges of tissue appreciated
bilaterally at the sites of palpable concern.

Targeted ultrasound was performed of the sites of palpable concern
throughout the RIGHT upper breast and RIGHT lower inner breast. No
suspicious cystic or solid mass is seen. Dense fibroglandular tissue
is noted throughout.

Targeted ultrasound was performed of the site of palpable concern in
the LEFT upper outer breast. No suspicious cystic or solid mass is
seen. Dense fibroglandular tissue is noted.
IMPRESSION: No sonographic evidence of malignancy at the sites of palpable
concern bilaterally. Any further workup of the patient's symptoms
should be based on the clinical assessment.

RECOMMENDATION:
Recommend clinical evaluation for assessment of lifetime risk of
breast cancer by the age of 25 given strong family history of breast
cancer in a maternal side.

If patient is of elevated lifetime risk of breast cancer, recommend
initiation of annual screening mammography at the age of 30 and
consideration of annual screening MRI at the age of 25. The American
Cancer Society recommends annual MRI and mammography in patients
with an estimated lifetime risk of developing breast cancer greater
than 20 - 25%, or who are known or suspected to be positive for the
breast cancer gene.

If patient is of average lifetime risk of breast cancer, recommend
annual screening mammogram at the age of 40 unless there are
intervening clinical concerns.

I have discussed the findings and recommendations with the patient.
If applicable, a reminder letter will be sent to the patient
regarding the next appointment.

BI-RADS CATEGORY  1: Negative.
